# Patient Record
Sex: Male | Born: 1975
Health system: Southern US, Community
[De-identification: ages and names within clinical notes are randomized; demographics above are authoritative.]

## PROBLEM LIST (undated history)

## (undated) DIAGNOSIS — K219 Gastro-esophageal reflux disease without esophagitis: Secondary | ICD-10-CM

## (undated) DIAGNOSIS — L0231 Cutaneous abscess of buttock: Secondary | ICD-10-CM

## (undated) DIAGNOSIS — Z87442 Personal history of urinary calculi: Secondary | ICD-10-CM

## (undated) DIAGNOSIS — L723 Sebaceous cyst: Secondary | ICD-10-CM

## (undated) DIAGNOSIS — N400 Enlarged prostate without lower urinary tract symptoms: Secondary | ICD-10-CM

## (undated) DIAGNOSIS — N492 Inflammatory disorders of scrotum: Secondary | ICD-10-CM

## (undated) DIAGNOSIS — Z8241 Family history of sudden cardiac death: Secondary | ICD-10-CM

## (undated) DIAGNOSIS — K449 Diaphragmatic hernia without obstruction or gangrene: Secondary | ICD-10-CM

## (undated) HISTORY — PX: PILONIDAL CYST DRAINAGE: SHX743

## (undated) HISTORY — DX: Cutaneous abscess of buttock: L02.31

## (undated) HISTORY — PX: PILONIDAL CYST EXCISION: SHX744

## (undated) HISTORY — DX: Inflammatory disorders of scrotum: N49.2

## (undated) HISTORY — DX: Gastro-esophageal reflux disease without esophagitis: K21.9

---

## 1998-06-06 ENCOUNTER — Emergency Department (HOSPITAL_COMMUNITY): Admission: EM | Admit: 1998-06-06 | Discharge: 1998-06-06 | Payer: Self-pay | Admitting: Emergency Medicine

## 1999-04-09 ENCOUNTER — Emergency Department (HOSPITAL_COMMUNITY): Admission: EM | Admit: 1999-04-09 | Discharge: 1999-04-09 | Payer: Self-pay | Admitting: *Deleted

## 2004-04-30 ENCOUNTER — Ambulatory Visit (HOSPITAL_BASED_OUTPATIENT_CLINIC_OR_DEPARTMENT_OTHER): Admission: RE | Admit: 2004-04-30 | Discharge: 2004-04-30 | Payer: Self-pay | Admitting: General Surgery

## 2004-04-30 ENCOUNTER — Ambulatory Visit (HOSPITAL_COMMUNITY): Admission: RE | Admit: 2004-04-30 | Discharge: 2004-04-30 | Payer: Self-pay | Admitting: General Surgery

## 2004-04-30 ENCOUNTER — Encounter (INDEPENDENT_AMBULATORY_CARE_PROVIDER_SITE_OTHER): Payer: Self-pay | Admitting: Specialist

## 2004-07-18 ENCOUNTER — Emergency Department (HOSPITAL_COMMUNITY): Admission: AD | Admit: 2004-07-18 | Discharge: 2004-07-18 | Payer: Self-pay | Admitting: Family Medicine

## 2004-09-30 ENCOUNTER — Emergency Department (HOSPITAL_COMMUNITY): Admission: EM | Admit: 2004-09-30 | Discharge: 2004-09-30 | Payer: Self-pay | Admitting: Family Medicine

## 2005-05-15 ENCOUNTER — Emergency Department (HOSPITAL_COMMUNITY): Admission: EM | Admit: 2005-05-15 | Discharge: 2005-05-15 | Payer: Self-pay | Admitting: Family Medicine

## 2009-04-01 ENCOUNTER — Emergency Department (HOSPITAL_COMMUNITY): Admission: EM | Admit: 2009-04-01 | Discharge: 2009-04-01 | Payer: Self-pay | Admitting: Family Medicine

## 2010-05-23 LAB — POCT URINALYSIS DIP (DEVICE)
Nitrite: NEGATIVE
Protein, ur: NEGATIVE mg/dL
Urobilinogen, UA: 0.2 mg/dL (ref 0.0–1.0)
pH: 6 (ref 5.0–8.0)

## 2010-05-23 LAB — GC/CHLAMYDIA PROBE AMP, GENITAL: GC Probe Amp, Genital: NEGATIVE

## 2010-07-23 NOTE — Op Note (Signed)
NAMEJACQUISE, Anthony Farley                  ACCOUNT NO.:  [id number]   MEDICAL RECORD NO.:  [id number]          PATIENT TYPE:  AMB   LOCATION:  [facility]                         FACILITY:  [facility]   PHYSICIAN:  Leonie Man, M.D.   DATE OF BIRTH:  Oct 25, 1975   DATE OF PROCEDURE:  04/30/2004  DATE OF DISCHARGE:                                 OPERATIVE REPORT   PREOPERATIVE DIAGNOSIS:  Recurrent pilonidal cyst.   POSTOPERATIVE DIAGNOSIS:  Recurrent pilonidal cyst.   PROCEDURE:  Excision and marsupialization of pilonidal cyst.   SURGEON:  Leonie Man, M.D.   ASSISTANT:  Nurse.   ANESTHESIA:  General.   Note, the patient is a 35 year old man with a recurrent pilonidal cyst with  drainage.  He has had this operated on twice before.  He comes to the  operating room now for excision of the residual cyst which extends down from  the primary pilonidal sinus onto the left buttock cheek.   DESCRIPTION OF PROCEDURE:  Following induction of satisfactory general  anesthesia, the patient is positioned supinely and the buttock is spread  apart and held with tape  after he is been turned into the prone position.  The area is shaved, prepped and draped to be included in the sterile  operative field. The inflamed cyst extended from the primary midline sinus  in an ellipse down onto the left buttock cheek. This was incised down upon  carrying dissection down to the presacral fascia in the midline. The entire  sinus, pilonidal and sinus tract were removed and forwarded for pathologic  evaluation. Hemostasis was secured with electrocautery. The edges of the  defect are then sutured down to the presacral fascia with interrupted  sutures of #0 Vicryl. Hemostasis was assured and the remaining marsupialized  areas packed with Xeroform gauze. A sterile dressing is applied. The  anesthetic reversed and the patient removed from the operating room to the  recovery room in stable condition. He tolerated the procedure  well.      PB/MEDQ  D:  04/30/2004  T:  04/30/2004  Job:  [id number]

## 2011-11-10 ENCOUNTER — Ambulatory Visit (INDEPENDENT_AMBULATORY_CARE_PROVIDER_SITE_OTHER): Payer: 59 | Admitting: General Surgery

## 2011-11-10 ENCOUNTER — Encounter (INDEPENDENT_AMBULATORY_CARE_PROVIDER_SITE_OTHER): Payer: Self-pay | Admitting: General Surgery

## 2011-11-10 VITALS — BP 122/78 | HR 72 | Temp 97.8°F | Resp 16 | Ht 68.0 in | Wt 191.0 lb

## 2011-11-10 DIAGNOSIS — L723 Sebaceous cyst: Secondary | ICD-10-CM

## 2011-11-10 DIAGNOSIS — L729 Follicular cyst of the skin and subcutaneous tissue, unspecified: Secondary | ICD-10-CM

## 2011-11-10 NOTE — Patient Instructions (Signed)
You have a chronically infected cyst in your right scrotum. You will be scheduled for excision of this cyst under local anesthesia.  You have extensive scarring from your multiple prior pilonidal surgeries. At this time there is no evidence of any infection, drainage, fluid collection, or cellulitis. I do not recommend any surgery on her pilonidal area at this time. Tt may become necessary in the future if you have further infection.

## 2011-11-10 NOTE — Progress Notes (Signed)
Patient ID: Anthony Farley, male   DOB: Apr 23, 1975, 36 y.o.   MRN: [id number]  Chief Complaint  Patient presents with  . New Evaluation    eval pil cyst    HPI Anthony Farley is a 36 y.o. male.  He is self-referred for evaluation of a chronically infected cyst of the right scrotum. He also was be to check his palate area where he's had multiple previous operations.  He developed a painful draining cyst in his right scrotum about 2 months ago. He saw an urgent care physician who performed incision and drainage and he was on antibiotics for 12  days. It is a little painful but continues to be a palpable mass that drains.  He also states that he has had multiple surgeries for pilonidal cyst by Dr. Lurene Shadow.   He says he had 3 operations under general anesthesia and other drainage procedures in the office. He states this intermittently will flare up and drain and he wants to know if further surgery is necessary.  Past history is significant for tobacco abuse, mild reflux symptoms and otherwise healthy. 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 HPI  Past Medical History  Diagnosis Date  . GERD (gastroesophageal reflux disease)     Past Surgical History  Procedure Date  . Pilonidal cyst excision   . Pilonidal cyst drainage     Family History  Problem Relation Age of Onset  . Heart disease Father     Social History History  Substance Use Topics  . Smoking status: Current Everyday Smoker -- 1.0 packs/day  . Smokeless tobacco: Not on file  . Alcohol Use: No    No Known Allergies  Current Outpatient Prescriptions  Medication Sig Dispense Refill  . Multiple Vitamins-Minerals (MULTIVITAMIN WITH MINERALS) tablet Take 1 tablet by mouth daily.        Review of Systems Review of Systems  Constitutional: Negative for fever, chills and unexpected weight change.  HENT: Negative for hearing loss, congestion, sore throat,  trouble swallowing and voice change.   Eyes: Negative for visual disturbance.  Respiratory: Negative for cough and wheezing.   Cardiovascular: Negative for chest pain, palpitations and leg swelling.  Gastrointestinal: Negative for nausea, vomiting, abdominal pain, diarrhea, constipation, blood in stool, abdominal distention, anal bleeding and rectal pain.  Genitourinary: Positive for scrotal swelling. Negative for hematuria and difficulty urinating.  Musculoskeletal: Negative for arthralgias.  Skin: Negative for rash and wound.  Neurological: Negative for seizures, syncope, weakness and headaches.  Hematological: Negative for adenopathy. Does not bruise/bleed easily.  Psychiatric/Behavioral: Negative for confusion.    Blood pressure 122/78, pulse 72, temperature 97.8 F (36.6 C), resp. rate 16, height 5\' 8"  (1.727 m), weight 191 lb (86.637 kg).  Physical Exam Physical Exam  Constitutional: He is oriented to person, place, and time. He appears well-developed and well-nourished. No distress.  HENT:  Head: Normocephalic.  Nose: Nose normal.  Mouth/Throat: No oropharyngeal exudate.  Eyes: Conjunctivae and EOM are normal. Pupils are equal, round, and reactive to light. Right eye exhibits no discharge. Left eye exhibits no discharge. No scleral icterus.  Neck: Normal range of motion. Neck supple. No JVD present. No tracheal deviation present. No thyromegaly present.  Cardiovascular: Normal rate, regular rhythm, normal heart sounds and intact distal pulses.   No murmur heard. Pulmonary/Chest: Effort normal and breath sounds normal. No stridor. No respiratory distress. He has no  wheezes. He has no rales. He exhibits no tenderness.  Abdominal: Soft. Bowel sounds are normal. He exhibits no distension and no mass. There is no tenderness. There is no rebound and no guarding.  Genitourinary:       1 cm inflamed mass in the right scrotum proximally. There is an open dimple. This appears to be a  chronic inflammatory cyst. No other scrotal masses. Penis scrotum and testes are normal.  Musculoskeletal: Normal range of motion. He exhibits no edema and no tenderness.  Lymphadenopathy:    He has no cervical adenopathy.  Neurological: He is alert and oriented to person, place, and time. He has normal reflexes. Coordination normal.  Skin: Skin is warm and dry. No rash noted. He is not diaphoretic. No erythema. No pallor.       Pilonidal area reveals multiple midline scars. This appears well healed. The tissues are soft and nontender. There is no draining sinus. There is no palpable mass or fluid collection. There is no cellulitis. There is no tenderness.  Psychiatric: He has a normal mood and affect. His behavior is normal. Judgment and thought content normal.    Data Reviewed   Assessment    Chronically infected right scrotal cyst. Excision under local anesthesia advised.  History multiple procedures for pilonidal cyst and sinus. Currently the wounds appear well healed and there does not appear to be any indication for surgical intervention  Tobacco abuse    Plan    The patient would like to have the scrotal cyst excised, and we will schedule that to be done under local anesthesia in the near future.  I discussed the indications, details, techniques, and numerous risks with the patient and his wife. They understand these issues. His questions were answered. He agrees with this plan.       Angelia Mould. Derrell Lolling, M.D., [facility] General and Minimally invasive Surgery Breast and Colorectal Surgery Office:   [phone] Pager:   [phone]  11/10/2011, 9:41 AM

## 2011-11-16 NOTE — H&P (Signed)
Anthony Farley    MRN: [id number]   Description: 36 year old male  Provider: Ernestene Mention, MD  Department: [facility]       Diagnoses     Scrotal cyst   - Primary    706.2        Vitals -    BP Pulse Temp Resp Ht Wt    122/78 72 97.8 F (36.6 C) 16 5\' 8"  (1.727 m) 191 lb (86.637 kg)    BMI - 29.04 kg/m2                 History and Physical    Ernestene Mention, MD  Patient ID: Anthony Farley, male   DOB: 13-Jan-1976, 36 y.o.   MRN: [id number]               HPI Anthony Farley is a 36 y.o. male.  He is self-referred for evaluation of a chronically infected cyst of the right scrotum. He also was be to check his palate area where he's had multiple previous operations.   He developed a painful draining cyst in his right scrotum about 2 months ago. He saw an urgent care physician who performed incision and drainage and he was on antibiotics for 12  days. It is a little painful but continues to be a palpable mass that drains.   He also states that he has had multiple surgeries for pilonidal cyst by Dr. Lurene Shadow.   He says he had 3 operations under general anesthesia and other drainage procedures in the office. He states this intermittently will flare up and drain and he wants to know if further surgery is necessary.   Past history is significant for tobacco abuse, mild reflux symptoms and otherwise healthy. 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     Past Medical History   Diagnosis  Date   .  GERD (gastroesophageal reflux disease)         Past Surgical History   Procedure  Date   .  Pilonidal cyst excision     .  Pilonidal cyst drainage         Family History   Problem  Relation  Age of Onset   .  Heart disease  Father        Social History History   Substance Use Topics   .  Smoking status:  Current Everyday Smoker -- 1.0 packs/day   .  Smokeless tobacco:  Not on file   .   Alcohol Use:  No      No Known Allergies    Current Outpatient Prescriptions   Medication  Sig  Dispense  Refill   .  Multiple Vitamins-Minerals (MULTIVITAMIN WITH MINERALS) tablet  Take 1 tablet by mouth daily.            Review of Systems   Constitutional: Negative for fever, chills and unexpected weight change.  HENT: Negative for hearing loss, congestion, sore throat, trouble swallowing and voice change.   Eyes: Negative for visual disturbance.  Respiratory: Negative for cough and wheezing.   Cardiovascular: Negative for chest pain, palpitations and leg swelling.  Gastrointestinal: Negative for nausea, vomiting, abdominal pain, diarrhea, constipation, blood in stool, abdominal distention, anal bleeding and rectal pain.  Genitourinary: Positive for scrotal swelling. Negative for hematuria and difficulty urinating.  Musculoskeletal: Negative for arthralgias.  Skin: Negative for rash  and wound.  Neurological: Negative for seizures, syncope, weakness and headaches.  Hematological: Negative for adenopathy. Does not bruise/bleed easily.  Psychiatric/Behavioral: Negative for confusion.    Blood pressure 122/78, pulse 72, temperature 97.8 F (36.6 C), resp. rate 16, height 5\' 8"  (1.727 m), weight 191 lb (86.637 kg).   Physical Exam   Constitutional: He is oriented to person, place, and time. He appears well-developed and well-nourished. No distress.  HENT:   Head: Normocephalic.   Nose: Nose normal.   Mouth/Throat: No oropharyngeal exudate.  Eyes: Conjunctivae and EOM are normal. Pupils are equal, round, and reactive to light. Right eye exhibits no discharge. Left eye exhibits no discharge. No scleral icterus.  Neck: Normal range of motion. Neck supple. No JVD present. No tracheal deviation present. No thyromegaly present.  Cardiovascular: Normal rate, regular rhythm, normal heart sounds and intact distal pulses.    No murmur heard. Pulmonary/Chest: Effort normal and breath  sounds normal. No stridor. No respiratory distress. He has no wheezes. He has no rales. He exhibits no tenderness.  Abdominal: Soft. Bowel sounds are normal. He exhibits no distension and no mass. There is no tenderness. There is no rebound and no guarding.  Genitourinary:       1 cm inflamed mass in the right scrotum proximally. There is an open dimple. This appears to be a chronic inflammatory cyst. No other scrotal masses. Penis scrotum and testes are normal.  Musculoskeletal: Normal range of motion. He exhibits no edema and no tenderness.  Lymphadenopathy:    He has no cervical adenopathy.  Neurological: He is alert and oriented to person, place, and time. He has normal reflexes. Coordination normal.  Skin: Skin is warm and dry. No rash noted. He is not diaphoretic. No erythema. No pallor.       Pilonidal area reveals multiple midline scars. This appears well healed. The tissues are soft and nontender. There is no draining sinus. There is no palpable mass or fluid collection. There is no cellulitis. There is no tenderness.  Psychiatric: He has a normal mood and affect. His behavior is normal. Judgment and thought content normal.    Data Reviewed     Assessment Chronically infected right scrotal cyst. Excision under local anesthesia advised.   History multiple procedures for pilonidal cyst and sinus. Currently the wounds appear well healed and there does not appear to be any indication for surgical intervention   Tobacco abuse   Plan The patient would like to have the scrotal cyst excised, and we will schedule that to be done under local anesthesia in the near future.   I discussed the indications, details, techniques, and numerous risks with the patient and his wife. They understand these issues. His questions were answered. He agrees with this plan.       Angelia Mould. Derrell Lolling, M.D., [facility] General and Minimally invasive Surgery Breast and  Colorectal Surgery Office:   [phone] Pager:   [phone]

## 2011-11-18 ENCOUNTER — Encounter (HOSPITAL_BASED_OUTPATIENT_CLINIC_OR_DEPARTMENT_OTHER): Payer: Self-pay | Admitting: *Deleted

## 2011-11-18 ENCOUNTER — Ambulatory Visit (HOSPITAL_BASED_OUTPATIENT_CLINIC_OR_DEPARTMENT_OTHER)
Admission: RE | Admit: 2011-11-18 | Discharge: 2011-11-18 | Disposition: A | Discharge status: Home / Self Care | Payer: 59 | Source: Ambulatory Visit | Attending: General Surgery | Admitting: General Surgery

## 2011-11-18 ENCOUNTER — Encounter (HOSPITAL_BASED_OUTPATIENT_CLINIC_OR_DEPARTMENT_OTHER)
Admission: RE | Disposition: A | Discharge status: Home / Self Care | Payer: Self-pay | Source: Ambulatory Visit | Attending: General Surgery

## 2011-11-18 DIAGNOSIS — L723 Sebaceous cyst: Secondary | ICD-10-CM | POA: Insufficient documentation

## 2011-11-18 HISTORY — PX: MASS EXCISION: SHX2000

## 2011-11-18 SURGERY — MINOR EXCISION OF MASS
Anesthesia: LOCAL | Site: Scrotum | Wound class: Clean

## 2011-11-18 MED ORDER — AMOXICILLIN-POT CLAVULANATE 875-125 MG PO TABS: 1.0000 | ORAL_TABLET | Freq: Two times a day (BID) | ORAL | Status: AC

## 2011-11-18 MED ORDER — CHLORHEXIDINE GLUCONATE 4 % EX LIQD: 1.0000 "application " | Freq: Once | CUTANEOUS | Status: DC

## 2011-11-18 MED ORDER — SODIUM BICARBONATE 4 % IV SOLN
INTRAVENOUS | Status: DC | PRN
  Administered 2011-11-18: 07:00:00 via INTRAMUSCULAR

## 2011-11-18 MED ORDER — HYDROCODONE-ACETAMINOPHEN 5-325 MG PO TABS: 1.0000 | ORAL_TABLET | ORAL | Status: AC | PRN

## 2011-11-18 SURGICAL SUPPLY — 38 items
ADH SKN CLS APL DERMABOND .7 (GAUZE/BANDAGES/DRESSINGS)
APL SKNCLS STERI-STRIP NONHPOA (GAUZE/BANDAGES/DRESSINGS)
BALL CTTN LRG ABS STRL LF (GAUZE/BANDAGES/DRESSINGS)
BENZOIN TINCTURE PRP APPL 2/3 (GAUZE/BANDAGES/DRESSINGS) IMPLANT
BLADE SURG 15 STRL LF DISP TIS (BLADE) ×1 IMPLANT
BLADE SURG 15 STRL SS (BLADE) ×2
CLOTH BEACON ORANGE TIMEOUT ST (SAFETY) ×2 IMPLANT
COTTONBALL LRG STERILE PKG (GAUZE/BANDAGES/DRESSINGS) IMPLANT
DERMABOND ADVANCED (GAUZE/BANDAGES/DRESSINGS)
DERMABOND ADVANCED .7 DNX12 (GAUZE/BANDAGES/DRESSINGS) IMPLANT
DRAPE SURG 17X23 STRL (DRAPES) ×1 IMPLANT
ELECT REM PT RETURN 9FT ADLT (ELECTROSURGICAL)
ELECTRODE REM PT RTRN 9FT ADLT (ELECTROSURGICAL) IMPLANT
GAUZE SPONGE 4X4 12PLY STRL LF (GAUZE/BANDAGES/DRESSINGS) IMPLANT
GLOVE ECLIPSE 6.5 STRL STRAW (GLOVE) ×1 IMPLANT
GLOVE EUDERMIC 7 POWDERFREE (GLOVE) ×2 IMPLANT
MARKER SKIN DUAL TIP RULER LAB (MISCELLANEOUS) ×2 IMPLANT
NDL HYPO 25X1 1.5 SAFETY (NEEDLE) ×1 IMPLANT
NDL SAFETY ECLIPSE 18X1.5 (NEEDLE) ×1 IMPLANT
NEEDLE HYPO 18GX1.5 SHARP (NEEDLE)
NEEDLE HYPO 25X1 1.5 SAFETY (NEEDLE) ×2 IMPLANT
NS IRRIG 1000ML POUR BTL (IV SOLUTION) ×1 IMPLANT
PENCIL BUTTON HOLSTER BLD 10FT (ELECTRODE) IMPLANT
STRIP CLOSURE SKIN 1/2X4 (GAUZE/BANDAGES/DRESSINGS) IMPLANT
SUT MNCRL AB 3-0 PS2 18 (SUTURE) IMPLANT
SUT MON AB 4-0 PC3 18 (SUTURE) IMPLANT
SUT SILK 3 0 TIES 17X18 (SUTURE)
SUT SILK 3-0 18XBRD TIE BLK (SUTURE) IMPLANT
SUT VIC AB 4-0 P-3 18XBRD (SUTURE) IMPLANT
SUT VIC AB 4-0 P3 18 (SUTURE) ×4
SUT VIC AB 5-0 P-3 18X BRD (SUTURE) IMPLANT
SUT VIC AB 5-0 P3 18 (SUTURE)
SUT VICRYL 3-0 CR8 SH (SUTURE) IMPLANT
SUT VICRYL AB 3 0 TIES (SUTURE) IMPLANT
SWABSTICK POVIDONE IODINE SNGL (MISCELLANEOUS) ×2 IMPLANT
SYR CONTROL 10ML LL (SYRINGE) ×2 IMPLANT
TOWEL OR 17X24 6PK STRL BLUE (TOWEL DISPOSABLE) IMPLANT
TRAY DSU PREP LF (CUSTOM PROCEDURE TRAY) IMPLANT

## 2011-11-18 NOTE — Op Note (Signed)
Patient Name:           [name]   Date of Surgery:        11/18/2011  Pre op Diagnosis:      Inflamed right scrotal mass, 2 cm in  Post op Diagnosis:    same  Procedure:             Excision 2 cm right scrotal mass      Surgeon:                     Angelia Mould. Derrell Lolling, M.D., FACS  Assistant:                      none  Operative Indication:   Anthony Farley is a 37 y.o. male. He is self-referred for evaluation of a chronically infected cyst of the right scrotum. He developed a painful draining cyst in his right scrotum about 2 months ago. He saw an urgent care physician who performed incision and drainage and he was on antibiotics for 12 days. It is a little painful but continues to be a palpable mass that drains. Exam reveals a 2 cm chronically inflamed mass in the right proximal scrotum with 2 small draining sites. This appears to be a very localized process involving skin subcutaneous tissue. He is brought to the operating room electively for excision of this area.       Operative Findings:       Chronic inflammatory mass right scrotum, suspect folliculitis or apocrine gland infection  Procedure in Detail:          The patient was taken to room 5 at [facility]. The procedure was performed under local anesthesia. The genitalia and scrotum were prepped and draped in a sterile fashion. Surgical time out was performed. 1% Xylocaine with epinephrine was used as a local infiltration anesthetic. A longitudinal elliptical incision was made excising these skin and subcutaneous tissue that was involved. The deeper tissues appeared soft and uninvolved. The specimen was sent to pathology. Hemostasis was excellent. The dermis was closed with some interrupted subcuticular sutures of 4-0 Vicryl. Antibiotic ointment, bandages and fishnet panties were placed. He tolerated this well. EBL 5 cc. Counts correct. Complications none.     Angelia Mould. Derrell Lolling, M.D., FACS General and Minimally Invasive Surgery Breast  and Colorectal Surgery  11/18/2011 7:43 AM

## 2011-11-18 NOTE — Interval H&P Note (Signed)
History and Physical Interval Note:  11/18/2011 7:09 AM  Anthony Farley  has presented today for surgery, with the diagnosis of scrotal cyst  The goals and the various methods of treatment have been discussed with the patient and family. After consideration of risks, benefits and other options for treatment, the patient has consented to  Procedure(s) (LRB) with comments: MINOR EXCISION OF MASS (N/A) - excise scrotal mass as a surgical intervention .  The patient's history has been reviewed, patient examined, no change in status, stable for surgery.  I have reviewed the patient's chart and labs.  Questions were answered to the patient's satisfaction.     Ernestene Mention

## 2011-11-21 NOTE — Progress Notes (Signed)
Quick Note:  Inform patient of Pathology report,. "benign cyst" ______ 

## 2011-11-22 ENCOUNTER — Telehealth (INDEPENDENT_AMBULATORY_CARE_PROVIDER_SITE_OTHER): Payer: Self-pay | Admitting: General Surgery

## 2011-11-22 ENCOUNTER — Encounter (HOSPITAL_BASED_OUTPATIENT_CLINIC_OR_DEPARTMENT_OTHER): Payer: Self-pay | Admitting: General Surgery

## 2011-11-22 NOTE — Telephone Encounter (Signed)
Called patient per Dr. Jacinto Halim request and advised of pathology report "benign cyst".

## 2011-12-01 ENCOUNTER — Ambulatory Visit (INDEPENDENT_AMBULATORY_CARE_PROVIDER_SITE_OTHER): Payer: 59 | Admitting: General Surgery

## 2011-12-01 ENCOUNTER — Encounter (INDEPENDENT_AMBULATORY_CARE_PROVIDER_SITE_OTHER): Payer: Self-pay | Admitting: General Surgery

## 2011-12-01 VITALS — BP 110/78 | HR 72 | Temp 97.4°F | Resp 12 | Ht 68.0 in | Wt 192.0 lb

## 2011-12-01 DIAGNOSIS — L723 Sebaceous cyst: Secondary | ICD-10-CM

## 2011-12-01 DIAGNOSIS — L729 Follicular cyst of the skin and subcutaneous tissue, unspecified: Secondary | ICD-10-CM

## 2011-12-01 NOTE — Patient Instructions (Signed)
The cyst in your right scrotum was a chronically inflamed, chronically infected epidermal inclusion cyst. There was no cancer.  The small open wound in the scrotum might be slightly infected, and so you have been given a prescription for Augmentin antibiotic, and your should take this pill every 12 hours for 8 days.  The wound should completely heal without any problems in 2 weeks. If it does not, then  return to see me in the office.

## 2011-12-01 NOTE — Progress Notes (Signed)
Patient ID: Anthony Farley, male   DOB: Oct 09, 1975, 36 y.o.   MRN: [id number] History: This patient underwent excision of a chronically infected cyst in his right scrotum on 11/18/2010. Final pathology shows a benign, ruptured follicle cyst or epidermal inclusion cyst. He states that he did well but this weekend it opened up and drained. Not much pain  Exam: Patient looks well. No distress. Scrotal wound, right scrotum shows a small opening, a slight tissue thickening, no purulence, no necrosis, no undrained pus.  Assessment: Chronically infected epidermal inclusion cyst right scrotum, status post excision. Wound not completely healed. I am not sure whether this is infected or not  Plan: Wound care, skin hygiene, bathing discussed. Augmentin 875 mg twice a day x 8 days He want to go back to work for another 10 days, and I filled out a back to work form for him for that. I told him that this should completely heal without any problems in 2 weeks. Return to see me if it does not heal completely.   Angelia Mould. Derrell Lolling, M.D., [facility] General and Minimally invasive Surgery Breast and Colorectal Surgery Office:   [phone] Pager:   [phone]

## 2011-12-12 ENCOUNTER — Telehealth (INDEPENDENT_AMBULATORY_CARE_PROVIDER_SITE_OTHER): Payer: Self-pay | Admitting: General Surgery

## 2011-12-12 NOTE — Telephone Encounter (Signed)
Patient called in and explained that he has a cyst removed and that he has just finished his second round of antibiotics but is still having drainage.  He was instructed to call back if he was still having drainage and had finished the antibiotic.  I was able to find him an opening on 10/17 at 8:30.  The patient took this spot.  He explained he is supposed to return to work tomorrow.  Advised the patient to try to keep this area dry and covered so it does not get infected.  He was okay with this.

## 2011-12-22 ENCOUNTER — Encounter (INDEPENDENT_AMBULATORY_CARE_PROVIDER_SITE_OTHER): Payer: 59 | Admitting: General Surgery

## 2012-01-05 ENCOUNTER — Ambulatory Visit (INDEPENDENT_AMBULATORY_CARE_PROVIDER_SITE_OTHER): Payer: 59 | Admitting: General Surgery

## 2012-01-05 ENCOUNTER — Encounter (INDEPENDENT_AMBULATORY_CARE_PROVIDER_SITE_OTHER): Payer: Self-pay | Admitting: General Surgery

## 2012-01-05 VITALS — BP 136/80 | HR 78 | Temp 98.6°F | Resp 18 | Ht 68.0 in | Wt 194.2 lb

## 2012-01-05 DIAGNOSIS — L729 Follicular cyst of the skin and subcutaneous tissue, unspecified: Secondary | ICD-10-CM

## 2012-01-05 DIAGNOSIS — L723 Sebaceous cyst: Secondary | ICD-10-CM

## 2012-01-05 NOTE — Patient Instructions (Signed)
The incision where your previous right scrotal cyst was excised has now completely healed. You have developed 2 new skin abscesses in the right scrotum. This looks like an infected hair follicles.  Your initial treatment will be clindamycin antibiotic pills for 7 days and warm soaks 3 times a day  Return to see Dr. Derrell Lolling if this does not completely clear it up.

## 2012-01-05 NOTE — Progress Notes (Signed)
Patient ID: Anthony Farley, male   DOB: [birth year], 36 y.o.   MRN: [id number] History: This patient underwent excision of an infected ruptured follicle cyst in the right scrotum of 11/18/2011. It was slow to heal but it has ultimately completely healed. He now presents with 2 new infected nodules lower down in the scrotum.  Exam: Patient is well appearing in no distress. Right scrotum was examined. The incision higher in the scrotum has completely healed. There are 2 inflamed follicle cyst in the scrotum with a little bit of drainage. Not fluctuant. Very localized. I examined both inguinal and both axillary areas and there is no evidence of hidradenitis.  Assessment: 2 new inflamed follicle cysts of right scrotum Uneventful healing of right scrotal cyst excision site  Plan: Clindamycin 300 mg every 6 hours x 7 days warm soaks 3 times daily Return to see me if this does not completely clear up. This may require further excision I did tell him about the diagnosis of hidradenitis, but there was no clinical evidence of that at this time.   Angelia Mould. Derrell Lolling, M.D., [facility] General and Minimally invasive Surgery Breast and Colorectal Surgery Office:   [phone] Pager:   [phone]

## 2012-02-07 ENCOUNTER — Encounter (INDEPENDENT_AMBULATORY_CARE_PROVIDER_SITE_OTHER): Payer: Self-pay | Admitting: Surgery

## 2012-02-07 ENCOUNTER — Ambulatory Visit (INDEPENDENT_AMBULATORY_CARE_PROVIDER_SITE_OTHER): Payer: 59 | Admitting: Surgery

## 2012-02-07 VITALS — BP 132/72 | HR 76 | Temp 98.1°F | Resp 16 | Ht 68.0 in | Wt 197.2 lb

## 2012-02-07 DIAGNOSIS — L0501 Pilonidal cyst with abscess: Secondary | ICD-10-CM | POA: Insufficient documentation

## 2012-02-07 NOTE — Progress Notes (Signed)
General Surgery [facility]  Visit Diagnoses: 1. Pilonidal cyst with abscess     HISTORY: The patient is a 36 year old black male known to our surgical practice from multiple incision and drainage of cyst involving the scrotum and perineum. Patient has a complex history of pilonidal cyst disease and has undergone at least 3 previous procedures for management. Over the past several days he has developed pain and swelling in the region of the natal cleft. He was seen 2 days ago and again today at the urgent care facility and underwent incision, drainage, and open packing. He was referred to general surgery for evaluation for eventual definitive surgery.  EXAM: Examination of the natal cleft shows an acute inflammatory process with induration. There is an open wound with gauze packing in place. There is minimal cellulitis. There is no residual fluctuance.  IMPRESSION: Pilonidal cyst with abscess, status post incision, drainage, and open packing  PLAN: Patient has been prescribed to antibiotics. He is scheduled to return to the urgent care tomorrow for dressing change. I think this is appropriate management at this time.  Patient will require definitive surgical resection. I would like to do this when there is not active infection ongoing. I have asked the patient to return in 6 weeks for wound check and to discuss definitive surgery. I have provided him with written literature to review. Certainly if he fails to progress with his current regimen, he should contact us and return for further surgical management.  Velora Heckler, MD, FACS General & Endocrine Surgery [facility]

## 2012-02-07 NOTE — Patient Instructions (Signed)
Pilonidal Cyst  A pilonidal cyst occurs when hairs get trapped (ingrown) beneath the skin in the crease between the buttocks over your sacrum (the bone under that crease). Pilonidal cysts are most common in young men with a lot of body hair. When the cyst is ruptured (breaks) or leaking, fluid from the cyst may cause burning and itching. If the cyst becomes infected, it causes a painful swelling filled with pus (abscess). The pus and trapped hairs need to be removed (often by lancing) so that the infection can heal. However, recurrence is common and an operation may be needed to remove the cyst.  HOME CARE INSTRUCTIONS    If the cyst was NOT INFECTED:   Keep the area clean and dry. Bathe or shower daily. Wash the area well with a germ-killing soap. Warm tub baths may help prevent infection and help with drainage. Dry the area well with a towel.   Avoid tight clothing to keep area as moisture free as possible.   Keep area between buttocks as free of hair as possible. A depilatory may be used.   If the cyst WAS INFECTED and needed to be drained:   Your caregiver packed the wound with gauze to keep the wound open. This allows the wound to heal from the inside outwards and continue draining.   Return for a wound check in 1 day or as suggested.   If you take tub baths or showers, repack the wound with gauze following them. Sponge baths (at the sink) are a good alternative.   If an antibiotic was ordered to fight the infection, take as directed.   Only take over-the-counter or prescription medicines for pain, discomfort, or fever as directed by your caregiver.   After the drain is removed, use sitz baths for 20 minutes 4 times per day. Clean the wound gently with mild unscented soap, pat dry, and then apply a dry dressing.  SEEK MEDICAL CARE IF:    You have increased pain, swelling, redness, drainage, or bleeding from the area.   You have a fever.   You have muscles aches, dizziness, or a general ill  feeling.  Document Released: 02/19/2000 Document Revised: 05/16/2011 Document Reviewed: 04/18/2008  Exit[employer] Patient Information 2013 [employer].

## 2012-03-21 ENCOUNTER — Ambulatory Visit (INDEPENDENT_AMBULATORY_CARE_PROVIDER_SITE_OTHER): Payer: 59 | Admitting: Surgery

## 2012-03-22 ENCOUNTER — Encounter (INDEPENDENT_AMBULATORY_CARE_PROVIDER_SITE_OTHER): Payer: Self-pay | Admitting: Surgery

## 2012-03-30 ENCOUNTER — Ambulatory Visit (INDEPENDENT_AMBULATORY_CARE_PROVIDER_SITE_OTHER): Payer: 59 | Admitting: General Surgery

## 2012-03-30 ENCOUNTER — Encounter (INDEPENDENT_AMBULATORY_CARE_PROVIDER_SITE_OTHER): Payer: Self-pay | Admitting: General Surgery

## 2012-03-30 VITALS — BP 117/63 | HR 76 | Temp 97.5°F | Resp 14 | Ht 68.0 in | Wt 200.6 lb

## 2012-03-30 DIAGNOSIS — L0501 Pilonidal cyst with abscess: Secondary | ICD-10-CM

## 2012-03-30 MED ORDER — DOXYCYCLINE HYCLATE 100 MG PO TABS: 100.0000 mg | ORAL_TABLET | Freq: Two times a day (BID) | ORAL | Status: AC

## 2012-03-30 NOTE — Patient Instructions (Signed)
Pick up prescription at pharmacy Change outer gauze daily. Remove packing strip on Sunday Can use over the counter ointment for jock itch

## 2012-03-30 NOTE — Progress Notes (Signed)
Subjective:     Patient ID: Anthony Farley, male   DOB: [birth year], 37 y.o.   MRN: [id number]  HPI 37 year old male comes in because of recurrent swelling and pain at his natal cleft. Patient has a history of infected pilonidal cyst. He also has undergone prior pilonidal cyst removals. He is scheduled for followup with Dr. Gerrit Friends in a few weeks to discuss definitive surgical management. He states that he started having some swelling last week. Earlier this week the area became very uncomfortable and tender and it started spontaneously draining earlier today. He denies any fevers or chills. He reports a good appetite. He also complains of some scrotal itching and burning.  Review of Systems     Objective:   Physical Exam BP 117/63  Pulse 76  Temp 97.5 F (36.4 C) (Temporal)  Resp 14  Ht 5\' 8"  (1.727 m)  Wt 200 lb 9.6 oz (90.992 kg)  BMI 30.50 kg/m2 Alert, nad nonfocal Gluteal cleft - old scar in midline extending to left side. Has opening to left of gluteal cleft at inf aspect of old incision, opening about 1cm, no cellulitis. Some surrounding induration. Tracks with q tip about 2cm toward midline Scrotum-he has some skin irritation over his left scrotum    Assessment:     Recurrent infected pilonidal cyst Jock itch    Plan:     The area appears adequately drained with respect to spinal cyst. I did place a quarter inch of iodoform gauze into the defect. I encouraged them to change the outer dressing daily and to remove the packing strip on Sunday. I gave him a prescription for doxycycline for one week. With respect to the scrotal itching and burning this appears to be consistent with jock itch. I recommended that he get over-the-counter ointment for jock itch. Otherwise followup Dr Gerrit Friends at previously scheduled appointment  Anthony Farley. [name], MD, FACS General, Bariatric, & Minimally Invasive Surgery [facility], [place]

## 2012-12-04 ENCOUNTER — Ambulatory Visit (INDEPENDENT_AMBULATORY_CARE_PROVIDER_SITE_OTHER): Payer: 59 | Admitting: Physician Assistant

## 2012-12-04 VITALS — BP 128/82 | HR 95 | Temp 98.7°F | Resp 17 | Ht 68.0 in | Wt 202.0 lb

## 2012-12-04 DIAGNOSIS — L259 Unspecified contact dermatitis, unspecified cause: Secondary | ICD-10-CM

## 2012-12-04 DIAGNOSIS — L309 Dermatitis, unspecified: Secondary | ICD-10-CM

## 2012-12-04 DIAGNOSIS — L299 Pruritus, unspecified: Secondary | ICD-10-CM

## 2012-12-04 DIAGNOSIS — J069 Acute upper respiratory infection, unspecified: Secondary | ICD-10-CM

## 2012-12-04 MED ORDER — GUAIFENESIN ER 1200 MG PO TB12: 1.0000 | ORAL_TABLET | Freq: Two times a day (BID) | ORAL | Status: DC | PRN

## 2012-12-04 MED ORDER — CLOBETASOL PROPIONATE 0.05 % EX CREA: TOPICAL_CREAM | Freq: Two times a day (BID) | CUTANEOUS | Status: DC

## 2012-12-04 MED ORDER — IPRATROPIUM BROMIDE 0.03 % NA SOLN: 2.0000 | Freq: Two times a day (BID) | NASAL | Status: DC

## 2012-12-04 MED ORDER — BENZONATATE 100 MG PO CAPS: 100.0000 mg | ORAL_CAPSULE | Freq: Three times a day (TID) | ORAL | Status: DC | PRN

## 2012-12-04 MED ORDER — PREDNISONE 20 MG PO TABS: ORAL_TABLET | ORAL | Status: DC

## 2012-12-04 NOTE — Progress Notes (Signed)
  Subjective:    Patient ID: Anthony Farley, male    DOB: February 23, 1976, 37 y.o.   MRN: [id number]  HPI  Anthony Farley is a 37 year old male smoker who is here today for a rash on his lower legs and a cold. The rash first appeared about 2.5 weeks ago. He went to [facility] and was told it was chigger bites. They gave him 5-day prednisone taper and triamcinolone cream. There was not much cream so it finished after a few days. After the prednisone taper, the rash was bit better but not resolved and still itching. He went back to [facility] and they gave him hydroxyzine for the itch. He start using a topical cream that his wife had from the derm. Last night, the rash had spread all the way up his right leg to his buttocks. The rash is red and warm, raised, and slightly different than the first chigger bites. He is still itching a lot.  He has tried every home remedy that he can think of.   Anthony Farley also complains of a cold that started Friday. He complains of head tightness, some productive cough, fatigue and SOB, nasal congestion down back of throat and post nasal drip over the weekend. Taking mucinex that is helping some. He denies fever, chills, and myalgias. NO history of asthma.   Review of Systems    as above Objective:   Physical Exam  Constitutional: He appears well-developed and well-nourished.  HENT:  Head: Normocephalic and atraumatic.  Right Ear: Tympanic membrane, external ear and ear canal normal.  Left Ear: Tympanic membrane, external ear and ear canal normal.  Nose: Rhinorrhea present. No mucosal edema. Right sinus exhibits no maxillary sinus tenderness and no frontal sinus tenderness. Left sinus exhibits no maxillary sinus tenderness and no frontal sinus tenderness.  Mouth/Throat: Uvula is midline, oropharynx is clear and moist and mucous membranes are normal.  Eyes: Conjunctivae are normal. Right eye exhibits no discharge. Left eye exhibits no discharge.  Cardiovascular: Normal rate, regular rhythm, S1  normal and S2 normal.   Pulmonary/Chest: Effort normal. No accessory muscle usage. He has no decreased breath sounds. Wheezes: faint wheeze that clears with cough.  Lymphadenopathy:    He has no cervical adenopathy.  Skin: Rash (maculopapular rash with discrete lesions on lower leg, many of which are excoriated. Behind leg and upper thigh to buttock of right leg, the rash becomes confluent. Warm to touch. Purplish coloration to rash on upper gluteus that appears to be resolving) noted. Rash is not pustular and not urticarial. He is not diaphoretic.         Assessment & Plan:  Dermatitis - Plan: clobetasol cream (TEMOVATE) 0.05 %, predniSONE (DELTASONE) 20 MG tablet  Itching - Plan: clobetasol cream (TEMOVATE) 0.05 %, predniSONE (DELTASONE) 20 MG tablet  Viral URI with cough - Plan: benzonatate (TESSALON) 100 MG capsule, Guaifenesin (MUCINEX MAXIMUM STRENGTH) 1200 MG TB12, ipratropium (ATROVENT) 0.03 % nasal spray  Try to avoid being overheated and dry off well after bathing. Wear loose fitting clothes and layers to be able to cool off quickly. Use the clobetasol cream over affected areas of the rash. Take prednisone taper for rash.  Use Guaifenesin and Atrovent to relieve nasal/sinus congestion. Use tessalon pearls as needed for cough.

## 2012-12-04 NOTE — Patient Instructions (Signed)
Drink plenty of fluids and rest. Use Tessalon pearls as needed for cough. Continue using Mucinex for sinus pressure. Use Atrovent nasal spray to help thin out mucus.   For rash, take 9-day prednisone taper. Use clobetasol cream over affected areas. Work on staying cool and dry, dress in loose fitting and light clothings. Getting hot or taking warm showers will make itching worse. Wear layers so you can cool off quickly.

## 2012-12-05 NOTE — Progress Notes (Signed)
I have examined this patient along with the student and agree.  

## 2012-12-31 ENCOUNTER — Other Ambulatory Visit: Payer: Self-pay | Admitting: Physician Assistant

## 2013-01-01 NOTE — Telephone Encounter (Signed)
Chelle, you Rx'd this for an acute issue, but do you want to give pt any RFs?

## 2013-01-03 NOTE — Telephone Encounter (Signed)
I authorized one more bottle.  If he still needs it, he should RTC.

## 2013-02-10 ENCOUNTER — Encounter (HOSPITAL_COMMUNITY): Payer: Self-pay | Admitting: Emergency Medicine

## 2013-02-10 ENCOUNTER — Emergency Department (HOSPITAL_COMMUNITY): Payer: 59

## 2013-02-10 ENCOUNTER — Emergency Department (HOSPITAL_COMMUNITY)
Admission: EM | Admit: 2013-02-10 | Discharge: 2013-02-10 | Disposition: A | Discharge status: Home / Self Care | Payer: 59 | Attending: Emergency Medicine | Admitting: Emergency Medicine

## 2013-02-10 DIAGNOSIS — F17200 Nicotine dependence, unspecified, uncomplicated: Secondary | ICD-10-CM | POA: Insufficient documentation

## 2013-02-10 DIAGNOSIS — Z79899 Other long term (current) drug therapy: Secondary | ICD-10-CM | POA: Insufficient documentation

## 2013-02-10 DIAGNOSIS — Z872 Personal history of diseases of the skin and subcutaneous tissue: Secondary | ICD-10-CM | POA: Insufficient documentation

## 2013-02-10 DIAGNOSIS — N200 Calculus of kidney: Secondary | ICD-10-CM | POA: Insufficient documentation

## 2013-02-10 DIAGNOSIS — K219 Gastro-esophageal reflux disease without esophagitis: Secondary | ICD-10-CM | POA: Insufficient documentation

## 2013-02-10 LAB — URINALYSIS, DIPSTICK ONLY
Bilirubin Urine: NEGATIVE
Glucose, UA: NEGATIVE mg/dL
Leukocytes, UA: NEGATIVE
Nitrite: NEGATIVE
Specific Gravity, Urine: 1.028 (ref 1.005–1.030)
Urobilinogen, UA: 1 mg/dL (ref 0.0–1.0)
pH: 6.5 (ref 5.0–8.0)

## 2013-02-10 LAB — COMPREHENSIVE METABOLIC PANEL
AST: 23 U/L (ref 0–37)
Albumin: 4.5 g/dL (ref 3.5–5.2)
BUN: 17 mg/dL (ref 6–23)
Calcium: 10 mg/dL (ref 8.4–10.5)
Chloride: 103 mEq/L (ref 96–112)
Creatinine, Ser: 1.34 mg/dL (ref 0.50–1.35)
GFR calc non Af Amer: 65 mL/min — ABNORMAL LOW (ref >90)
Sodium: 139 mEq/L (ref 135–145)
Total Bilirubin: 0.5 mg/dL (ref 0.3–1.2)

## 2013-02-10 LAB — CBC
HCT: 48.1 % (ref 39.0–52.0)
MCV: 86.2 fL (ref 78.0–100.0)
RDW: 13 % (ref 11.5–15.5)
WBC: 16.5 10*3/uL — ABNORMAL HIGH (ref 4.0–10.5)

## 2013-02-10 LAB — LIPASE, BLOOD: Lipase: 27 U/L (ref 11–59)

## 2013-02-10 MED ORDER — OXYCODONE-ACETAMINOPHEN 7.5-325 MG PO TABS: 1.0000 | ORAL_TABLET | Freq: Four times a day (QID) | ORAL | Status: DC | PRN

## 2013-02-10 MED ORDER — MORPHINE SULFATE 2 MG/ML IJ SOLN
2.0000 mg | Freq: Once | INTRAMUSCULAR | Status: AC
  Administered 2013-02-10: 2 mg via INTRAVENOUS
  Filled 2013-02-10: qty 1

## 2013-02-10 MED ORDER — IOHEXOL 300 MG/ML  SOLN
100.0000 mL | Freq: Once | INTRAMUSCULAR | Status: AC | PRN
  Administered 2013-02-10: 100 mL via INTRAVENOUS

## 2013-02-10 MED ORDER — MORPHINE SULFATE 4 MG/ML IJ SOLN
4.0000 mg | Freq: Once | INTRAMUSCULAR | Status: AC
  Administered 2013-02-10: 4 mg via INTRAVENOUS
  Filled 2013-02-10: qty 1

## 2013-02-10 MED ORDER — IBUPROFEN 800 MG PO TABS: 800.0000 mg | ORAL_TABLET | Freq: Three times a day (TID) | ORAL | Status: DC | PRN

## 2013-02-10 MED ORDER — HYDROMORPHONE HCL PF 1 MG/ML IJ SOLN
1.0000 mg | Freq: Once | INTRAMUSCULAR | Status: AC
  Administered 2013-02-10: 1 mg via INTRAVENOUS
  Filled 2013-02-10: qty 1

## 2013-02-10 MED ORDER — KETOROLAC TROMETHAMINE 30 MG/ML IJ SOLN
30.0000 mg | Freq: Once | INTRAMUSCULAR | Status: AC
  Administered 2013-02-10: 30 mg via INTRAVENOUS
  Filled 2013-02-10: qty 1

## 2013-02-10 MED ORDER — TAMSULOSIN HCL 0.4 MG PO CAPS: 0.4000 mg | ORAL_CAPSULE | Freq: Every day | ORAL | Status: DC

## 2013-02-10 MED ORDER — IOHEXOL 300 MG/ML  SOLN
50.0000 mL | Freq: Once | INTRAMUSCULAR | Status: AC | PRN
  Administered 2013-02-10: 50 mL via ORAL

## 2013-02-10 MED ORDER — MORPHINE SULFATE 4 MG/ML IJ SOLN: 4.0000 mg | Freq: Once | INTRAMUSCULAR | Status: DC

## 2013-02-10 NOTE — ED Notes (Signed)
Patient will call when he has to urinate 

## 2013-02-10 NOTE — ED Notes (Addendum)
Pt reports diffuse abdominal pain without radiation; onset between 0600 and 0730 this morning. Pt had a BM at work and had some pain, then tried to have another BM thinking it was related to this, but did not pass more stool. Pain continued; pt came to ED. Pt has no hx of this type of pain.   Addendum: Pt states hx of inguinal hernia.

## 2013-02-10 NOTE — ED Provider Notes (Signed)
CSN: [id number]     Arrival date & time 02/10/13  0759 History   First MD Initiated Contact with Patient 02/10/13 0804     No chief complaint on file.  (Consider location/radiation/quality/duration/timing/severity/associated sxs/prior Treatment) HPI Comments: Anthony Farley is a 37 year old male with a PMH of GERD and pilonidal cyst presenting with a 3 hour historyof lower abdominal pain.  He had a normal morning and went in early to work.  He had a normal bowel movement at work but felt as if he needed to have another one and then began to experience lower abdominal pain.  He describes the pain as 9/10, constant and non-radiating.  He denies N/V, fevers/chills, diarrhea, constipation, dysuria, hematuria or back pain.  The pain is not affected by position change.  He has not taken any medication to try and relieve the pain.     Past Medical History  Diagnosis Date  . GERD (gastroesophageal reflux disease)   . Abscess of buttock   . Scrotal abscess    Past Surgical History  Procedure Laterality Date  . Pilonidal cyst excision    . Pilonidal cyst drainage    . Mass excision  11/18/2011    Procedure: MINOR EXCISION OF MASS;  Surgeon: Ernestene Mention, MD;  Location: [facility];  Service: General;  Laterality: N/A;  excise scrotal mass   Family History  Problem Relation Age of Onset  . Heart disease Father   . Diabetes Paternal Grandmother    History  Substance Use Topics  . Smoking status: Current Every Day Smoker -- 1.00 packs/day for 22 years    Types: Cigarettes  . Smokeless tobacco: Not on file  . Alcohol Use: No    Review of Systems  Constitutional: Negative for fever and chills.  HENT: Negative for rhinorrhea and sore throat.   Respiratory: Negative for shortness of breath.   Cardiovascular: Negative for chest pain.  Gastrointestinal: Positive for abdominal pain. Negative for nausea, vomiting, diarrhea and constipation.  Genitourinary: Negative for dysuria and  hematuria.  Musculoskeletal: Negative for back pain.    Allergies  Review of patient's allergies indicates no known allergies.  Home Medications   Current Outpatient Rx  Name  Route  Sig  Dispense  Refill  . esomeprazole (NEXIUM) 40 MG capsule   Oral   Take 40 mg by mouth daily at 12 noon.          BP 151/93  Pulse 97  Temp(Src) 97.3 F (36.3 C) (Oral)  Resp 14  Ht 5\' 8"  (1.727 m)  Wt 205 lb (92.987 kg)  BMI 31.18 kg/m2  SpO2 99% Physical Exam  Constitutional: He is oriented to person, place, and time. He appears well-developed and well-nourished. He appears distressed.  HENT:  Head: Normocephalic and atraumatic.  Mouth/Throat: No oropharyngeal exudate.  Eyes: Pupils are equal, round, and reactive to light. No scleral icterus.  Neck: Neck supple.  Cardiovascular: Normal rate, regular rhythm and normal heart sounds.   Pulmonary/Chest: Effort normal and breath sounds normal. No respiratory distress. He has no wheezes. He has no rales.  Abdominal: Soft. Bowel sounds are normal. He exhibits no distension. There is no rebound and no guarding.  + lower abdomen tender to light palpation  No CVAT  Musculoskeletal: Normal range of motion.  Neurological: He is alert and oriented to person, place, and time.  Skin: He is not diaphoretic.  Psychiatric: He has a normal mood and affect. His behavior is normal.  ED Course  Procedures (including critical care time) Labs Review Labs Reviewed  CBC  COMPREHENSIVE METABOLIC PANEL  LIPASE, BLOOD  URINALYSIS, DIPSTICK ONLY   Imaging Review No results found.  EKG Interpretation   None       MDM  37 year old male with a PMH of GERD and pilonidal cysts presenting with lower abdominal pain.  Differential includes appendicitis, kidney stones, diverticular disease.  Will obtain CBC, CMP, lipase, UA.  Morphine for pain control.  Morphine did not help his pain.  Gave 1mg  Dilaudid, then 30mg  Toradol which relieved his pain.  +  leukocytosis; no hematuria; 3mm stone in L distal ureter and mild left hydronephrosis and hydroureter; Urology consulted for recommendations for follow-up.  Urology advised that patient can be discharged with Flomax and the stone will likely pass on its own.  They suggest he follow-up with [facility].    The patient has been instructed to take Flomax until the stone passes.  He has been provided with Percocet and Ibuprofen.  His Ca level was on the high end of normal (10) so I will obtain a PTH and patient has agreed to follow-up with a primary care doctor regarding this.  Anthony Peat, DO 02/10/13 [phone]

## 2013-02-11 LAB — URINE CULTURE

## 2013-02-11 LAB — PARATHYROID HORMONE, INTACT (NO CA): PTH: 73.3 pg/mL — ABNORMAL HIGH (ref 14.0–72.0)

## 2013-02-14 NOTE — ED Provider Notes (Signed)
I saw and evaluated the patient, reviewed the resident's note and I agree with the findings and plan.   .Face to face Exam:  General:  Awake HEENT:  Atraumatic Resp:  Normal effort Abd:  Nondistended Neuro:No focal weakness  Nelia Shi, MD 02/14/13 1303

## 2013-03-13 ENCOUNTER — Encounter (INDEPENDENT_AMBULATORY_CARE_PROVIDER_SITE_OTHER): Payer: 59 | Admitting: Surgery

## 2013-03-13 ENCOUNTER — Telehealth (INDEPENDENT_AMBULATORY_CARE_PROVIDER_SITE_OTHER): Payer: Self-pay

## 2013-03-13 NOTE — Telephone Encounter (Signed)
LMOM re; missed appt.\ Notified Dr Dahlstedt's office pt failed to keep appt today.

## 2013-03-19 ENCOUNTER — Encounter (INDEPENDENT_AMBULATORY_CARE_PROVIDER_SITE_OTHER): Payer: Self-pay | Admitting: Surgery

## 2013-11-20 ENCOUNTER — Encounter: Payer: Self-pay | Admitting: Gastroenterology

## 2014-01-07 ENCOUNTER — Institutional Professional Consult (permissible substitution): Payer: 59 | Admitting: Pulmonary Disease

## 2014-01-20 ENCOUNTER — Ambulatory Visit: Payer: 59 | Admitting: Gastroenterology

## 2014-02-14 ENCOUNTER — Institutional Professional Consult (permissible substitution): Payer: 59 | Admitting: Pulmonary Disease

## 2014-02-18 ENCOUNTER — Encounter: Payer: Self-pay | Admitting: Pulmonary Disease

## 2016-04-18 DIAGNOSIS — F17210 Nicotine dependence, cigarettes, uncomplicated: Secondary | ICD-10-CM | POA: Insufficient documentation

## 2016-04-18 DIAGNOSIS — Z8249 Family history of ischemic heart disease and other diseases of the circulatory system: Secondary | ICD-10-CM | POA: Insufficient documentation

## 2016-04-18 DIAGNOSIS — K2100 Gastro-esophageal reflux disease with esophagitis, without bleeding: Secondary | ICD-10-CM | POA: Insufficient documentation

## 2016-06-13 HISTORY — PX: COLONOSCOPY WITH ESOPHAGOGASTRODUODENOSCOPY (EGD): SHX5779

## 2017-09-20 DIAGNOSIS — N400 Enlarged prostate without lower urinary tract symptoms: Secondary | ICD-10-CM | POA: Insufficient documentation

## 2017-09-20 DIAGNOSIS — E669 Obesity, unspecified: Secondary | ICD-10-CM | POA: Insufficient documentation

## 2018-08-24 ENCOUNTER — Encounter (HOSPITAL_COMMUNITY): Payer: Self-pay | Admitting: Emergency Medicine

## 2018-08-24 ENCOUNTER — Other Ambulatory Visit: Payer: Self-pay

## 2018-08-24 ENCOUNTER — Emergency Department (HOSPITAL_COMMUNITY): Payer: 59

## 2018-08-24 ENCOUNTER — Emergency Department (HOSPITAL_COMMUNITY)
Admission: EM | Admit: 2018-08-24 | Discharge: 2018-08-24 | Disposition: A | Discharge status: Home / Self Care | Payer: 59 | Attending: Emergency Medicine | Admitting: Emergency Medicine

## 2018-08-24 DIAGNOSIS — F17210 Nicotine dependence, cigarettes, uncomplicated: Secondary | ICD-10-CM | POA: Insufficient documentation

## 2018-08-24 DIAGNOSIS — N200 Calculus of kidney: Secondary | ICD-10-CM | POA: Diagnosis not present

## 2018-08-24 DIAGNOSIS — R1032 Left lower quadrant pain: Secondary | ICD-10-CM | POA: Diagnosis present

## 2018-08-24 LAB — URINALYSIS, ROUTINE W REFLEX MICROSCOPIC
Bacteria, UA: NONE SEEN
Bilirubin Urine: NEGATIVE
Glucose, UA: NEGATIVE mg/dL
Ketones, ur: 5 mg/dL — AB
Leukocytes,Ua: NEGATIVE
Nitrite: NEGATIVE
Protein, ur: NEGATIVE mg/dL
Specific Gravity, Urine: 1.024 (ref 1.005–1.030)
pH: 5 (ref 5.0–8.0)

## 2018-08-24 LAB — CBC WITH DIFFERENTIAL/PLATELET
Abs Immature Granulocytes: 0.08 10*3/uL — ABNORMAL HIGH (ref 0.00–0.07)
Basophils Absolute: 0.1 10*3/uL (ref 0.0–0.1)
Basophils Relative: 0 %
Eosinophils Absolute: 0 10*3/uL (ref 0.0–0.5)
Eosinophils Relative: 0 %
HCT: 51.6 % (ref 39.0–52.0)
Hemoglobin: 16.5 g/dL (ref 13.0–17.0)
Immature Granulocytes: 1 %
Lymphocytes Relative: 7 %
Lymphs Abs: 1 10*3/uL (ref 0.7–4.0)
MCH: 28.7 pg (ref 26.0–34.0)
MCHC: 32 g/dL (ref 30.0–36.0)
MCV: 89.9 fL (ref 80.0–100.0)
Monocytes Absolute: 1 10*3/uL (ref 0.1–1.0)
Monocytes Relative: 7 %
Neutro Abs: 12 10*3/uL — ABNORMAL HIGH (ref 1.7–7.7)
Neutrophils Relative %: 85 %
Platelets: 212 10*3/uL (ref 150–400)
RBC: 5.74 MIL/uL (ref 4.22–5.81)
RDW: 13 % (ref 11.5–15.5)
WBC: 14.1 10*3/uL — ABNORMAL HIGH (ref 4.0–10.5)
nRBC: 0 % (ref 0.0–0.2)

## 2018-08-24 LAB — COMPREHENSIVE METABOLIC PANEL
ALT: 23 U/L (ref 0–44)
AST: 28 U/L (ref 15–41)
Albumin: 4.4 g/dL (ref 3.5–5.0)
Alkaline Phosphatase: 61 U/L (ref 38–126)
Anion gap: 10 (ref 5–15)
BUN: 18 mg/dL (ref 6–20)
CO2: 22 mmol/L (ref 22–32)
Calcium: 9.2 mg/dL (ref 8.9–10.3)
Chloride: 106 mmol/L (ref 98–111)
Creatinine, Ser: 1.66 mg/dL — ABNORMAL HIGH (ref 0.61–1.24)
GFR calc Af Amer: 57 mL/min — ABNORMAL LOW (ref >60)
GFR calc non Af Amer: 49 mL/min — ABNORMAL LOW (ref >60)
Glucose, Bld: 98 mg/dL (ref 70–99)
Potassium: 4.2 mmol/L (ref 3.5–5.1)
Sodium: 138 mmol/L (ref 135–145)
Total Bilirubin: 0.7 mg/dL (ref 0.3–1.2)
Total Protein: 7.3 g/dL (ref 6.5–8.1)

## 2018-08-24 MED ORDER — OXYCODONE HCL 5 MG PO TABS: 5.0000 mg | ORAL_TABLET | ORAL | 0 refills | Status: DC | PRN

## 2018-08-24 MED ORDER — SODIUM CHLORIDE 0.9 % IV BOLUS
1000.0000 mL | Freq: Once | INTRAVENOUS | Status: AC
Start: 2018-08-24 — End: 2018-08-24
  Administered 2018-08-24: 1000 mL via INTRAVENOUS

## 2018-08-24 MED ORDER — KETOROLAC TROMETHAMINE 30 MG/ML IJ SOLN
30.0000 mg | Freq: Once | INTRAMUSCULAR | Status: AC
  Administered 2018-08-24: 30 mg via INTRAVENOUS
  Filled 2018-08-24: qty 1

## 2018-08-24 MED ORDER — ONDANSETRON 4 MG PO TBDP: 4.0000 mg | ORAL_TABLET | Freq: Three times a day (TID) | ORAL | 0 refills | Status: DC | PRN

## 2018-08-24 MED ORDER — ONDANSETRON HCL 4 MG/2ML IJ SOLN
4.0000 mg | Freq: Once | INTRAMUSCULAR | Status: AC
  Administered 2018-08-24: 4 mg via INTRAVENOUS
  Filled 2018-08-24: qty 2

## 2018-08-24 NOTE — ED Notes (Signed)
Patient's bladder scan was repeated x2 for accuracy, showed 0 mL of urine in bladder. Patient stated he had voided x2 since Toradol and was feeling much better. MD notified.

## 2018-08-24 NOTE — ED Triage Notes (Signed)
Pt c/o lower abd pains that radiates to testicles starting this morning. Also hasnt been able to urinate since this morning. Hx kidney stones.

## 2018-08-24 NOTE — Discharge Instructions (Addendum)
Take Tylenol 1000 mg 4 times a day for 1 week. This is the maximum dose of Tylenol usually take from all sources. Please check other over-the-counter medications and prescriptions to ensure you are not taking other medications that contain acetaminophen.  Take oxycodone as needed for breakthrough pain.  This medication can be addicting, sedating and cause constipation.

## 2018-08-24 NOTE — ED Notes (Signed)
Bladder scan= 0 ml all 3 attempts

## 2018-08-24 NOTE — ED Provider Notes (Signed)
[facility]-EMERGENCY DEPT Provider Note   CSN: [id number] Arrival date & time: 08/24/18  1436    History   Chief Complaint Chief Complaint  Patient presents with   Abdominal Pain   Groin Swelling   Urinary Retention    HPI Anthony Farley is a 43 y.o. male.     HPI   Lower abdomen, radiating into scrotum and left flank Started this AM No testicular trauma, no fevers Woke up feeling this lower abdominal pain, feels like prior kidney stones Pain now severe, waxing and waning, got medication from neighbor which initially helped No dysuria, no hematuria Hx of enlarged prostate, takes flomax Difficulty urinating today  Past Medical History:  Diagnosis Date   Abscess of buttock    GERD (gastroesophageal reflux disease)    Scrotal abscess     Patient Active Problem List   Diagnosis Date Noted   Pilonidal cyst with abscess 02/07/2012    Past Surgical History:  Procedure Laterality Date   MASS EXCISION  11/18/2011   Procedure: MINOR EXCISION OF MASS;  Surgeon: Ernestene MentionHaywood M Ingram, MD;  Location: [facility];  Service: General;  Laterality: N/A;  excise scrotal mass   PILONIDAL CYST DRAINAGE     PILONIDAL CYST EXCISION          Home Medications    Prior to Admission medications   Medication Sig Start Date End Date Taking? Authorizing Provider  RABEprazole (ACIPHEX) 20 MG tablet Take 20 mg by mouth daily. 06/14/18  Yes [provider]  ranitidine (ZANTAC) 150 MG tablet Take 150 mg by mouth daily as needed for heartburn.   Yes [provider]  tamsulosin (FLOMAX) 0.4 MG CAPS capsule Take 1 capsule (0.4 mg total) by mouth daily. 02/10/13  Yes Yolanda MangesWilson, Alex M, DO  ondansetron (ZOFRAN ODT) 4 MG disintegrating tablet Take 1 tablet (4 mg total) by mouth every 8 (eight) hours as needed for nausea or vomiting. 08/24/18   Alvira MondaySchlossman, Windsor Zirkelbach, MD  oxyCODONE (ROXICODONE) 5 MG immediate release tablet Take 1 tablet (5 mg total)  by mouth every 4 (four) hours as needed for severe pain. 08/24/18   Alvira MondaySchlossman, Reeve Turnley, MD    Family History Family History  Problem Relation Age of Onset   Heart disease Father    Diabetes Paternal Grandmother     Social History Social History   Tobacco Use   Smoking status: Current Every Day Smoker    Packs/day: 1.00    Years: 22.00    Pack years: 22.00    Types: Cigarettes   Smokeless tobacco: Never Used  Substance Use Topics   Alcohol use: No   Drug use: No     Allergies   Patient has no known allergies.   Review of Systems Review of Systems  Constitutional: Negative for fever.  HENT: Negative for sore throat.   Eyes: Negative for visual disturbance.  Respiratory: Negative for shortness of breath.   Cardiovascular: Negative for chest pain.  Gastrointestinal: Positive for abdominal pain and nausea. Negative for constipation, diarrhea and vomiting.  Genitourinary: Positive for difficulty urinating. Negative for dysuria and urgency.  Musculoskeletal: Negative for back pain and neck stiffness.  Skin: Negative for rash.  Neurological: Negative for syncope and headaches.     Physical Exam Updated Vital Signs BP 128/84 (BP Location: Left Arm)    Pulse (!) 58    Temp 98.5 F (36.9 C) (Oral)    Resp 16    SpO2 100%   Physical  Exam Vitals signs and nursing note reviewed.  Constitutional:      General: He is not in acute distress.    Appearance: He is well-developed. He is not diaphoretic.  HENT:     Head: Normocephalic and atraumatic.  Eyes:     Conjunctiva/sclera: Conjunctivae normal.  Neck:     Musculoskeletal: Normal range of motion.  Cardiovascular:     Rate and Rhythm: Normal rate and regular rhythm.     Heart sounds: Normal heart sounds. No murmur. No friction rub. No gallop.   Pulmonary:     Effort: Pulmonary effort is normal. No respiratory distress.     Breath sounds: Normal breath sounds. No wheezing or rales.  Abdominal:     General: There  is no distension.     Palpations: Abdomen is soft.     Tenderness: There is abdominal tenderness in the suprapubic area. There is left CVA tenderness. There is no guarding.  Skin:    General: Skin is warm and dry.  Neurological:     Mental Status: He is alert and oriented to person, place, and time.      ED Treatments / Results  Labs (all labs ordered are listed, but only abnormal results are displayed) Labs Reviewed  CBC WITH DIFFERENTIAL/PLATELET - Abnormal; Notable for the following components:      Result Value   WBC 14.1 (*)    Neutro Abs 12.0 (*)    Abs Immature Granulocytes 0.08 (*)    All other components within normal limits  COMPREHENSIVE METABOLIC PANEL - Abnormal; Notable for the following components:   Creatinine, Ser 1.66 (*)    GFR calc non Af Amer 49 (*)    GFR calc Af Amer 57 (*)    All other components within normal limits  URINALYSIS, ROUTINE W REFLEX MICROSCOPIC - Abnormal; Notable for the following components:   Hgb urine dipstick SMALL (*)    Ketones, ur 5 (*)    All other components within normal limits  URINE CULTURE    EKG None  Radiology Ct Renal Stone Study  Result Date: 08/24/2018 CLINICAL DATA:  Low abdominal pain radiating to the testicles since this morning. Unable to urinate since this morning. History of kidney stones. EXAM: CT ABDOMEN AND PELVIS WITHOUT CONTRAST TECHNIQUE: Multidetector CT imaging of the abdomen and pelvis was performed following the standard protocol without IV contrast. COMPARISON:  02/10/2013. FINDINGS: Lower chest: Minimal bibasilar linear atelectasis or scarring. Hepatobiliary: No focal liver abnormality is seen. No gallstones, gallbladder wall thickening, or biliary dilatation. Pancreas: Unremarkable. No pancreatic ductal dilatation or surrounding inflammatory changes. Spleen: Normal in size without focal abnormality. Adrenals/Urinary Tract: The previously demonstrated 1 cm upper pole right renal cyst currently measures  2.1 cm with noncontrast features of a simple cyst. Interval diffuse enlargement of the left kidney with perinephric soft tissue stranding and mild dilatation of the left renal collecting system and ureter to the level of a 3 mm distal left ureteral calculus at the ureterovesical junction. This is not visible on the scout image. A nearby tiny phlebolith is unchanged. Unremarkable urinary bladder and right ureter. Normal appearing adrenal glands. Stomach/Bowel: Stomach is within normal limits. Appendix appears normal. No evidence of bowel wall thickening, distention, or inflammatory changes. Vascular/Lymphatic: No significant vascular findings are present. No enlarged abdominal or pelvic lymph nodes. Reproductive: Moderately enlarged prostate gland. Other: Small to moderate-sized umbilical hernia containing fat. Musculoskeletal: Mild lower thoracic spine degenerative changes. IMPRESSION: 1. 3 mm distal left ureteral calculus  at the ureterovesical junction, causing mild left hydronephrosis and hydroureter. 2. Moderate prostatic hypertrophy. Electronically Signed   By: Claudie Revering M.D.   On: 08/24/2018 17:09    Procedures Procedures (including critical care time)  Medications Ordered in ED Medications  sodium chloride 0.9 % bolus 1,000 mL (0 mLs Intravenous Stopped 08/24/18 1744)  ketorolac (TORADOL) 30 MG/ML injection 30 mg (30 mg Intravenous Given 08/24/18 1626)  ondansetron (ZOFRAN) injection 4 mg (4 mg Intravenous Given 08/24/18 1626)     Initial Impression / Assessment and Plan / ED Course  I have reviewed the triage vital signs and the nursing notes.  Pertinent labs & imaging results that were available during my care of the patient were reviewed by me and considered in my medical decision making (see chart for details).        43 year old male with history of nephrolithiasis presents with concern for suprapubic and left flank pain as well as urinary retentive symptoms.    CT completed shows  3 mm obstructing UVJ stone with left-sided hydronephrosis.  No sign of urinary tract infection.  When patient first arrived to the emergency department, he had difficulty urinating, but reported improvement with improved pain after toradol.  Repeat bladder scan prior to discharge shows no urine left in the bladder and he has no sensation of needing to urinate.  Do suspect the pain from his kidney stones and potentially the medication he is given contributed to some retentive symptoms in the setting of known prostatic hypertrophy.  His creatinine is mildly elevated from prior value many years ago.  Recommend Tylenol for pain and given prescription for oxycodone after reviewing the [facility] drug database.  Discussed risks of the medication.  Recommend hydration, continuing to take his home Flomax, and follow-up with urology.  Discussed reasons to return to the emergency department in detail, including urinary retention, fevers or other symptoms. Patient discharged in stable condition with understanding of reasons to return.   Final Clinical Impressions(s) / ED Diagnoses   Final diagnoses:  Nephrolithiasis    ED Discharge Orders         Ordered    oxyCODONE (ROXICODONE) 5 MG immediate release tablet  Every 4 hours PRN     08/24/18 1908    ondansetron (ZOFRAN ODT) 4 MG disintegrating tablet  Every 8 hours PRN     08/24/18 1908           Gareth Morgan, MD 08/24/18 2302

## 2018-08-25 LAB — URINE CULTURE: Culture: NO GROWTH

## 2019-06-12 ENCOUNTER — Encounter (HOSPITAL_BASED_OUTPATIENT_CLINIC_OR_DEPARTMENT_OTHER): Payer: Self-pay | Admitting: General Surgery

## 2019-06-13 ENCOUNTER — Encounter (HOSPITAL_BASED_OUTPATIENT_CLINIC_OR_DEPARTMENT_OTHER): Payer: Self-pay | Admitting: General Surgery

## 2019-06-13 ENCOUNTER — Other Ambulatory Visit: Payer: Self-pay

## 2019-06-13 NOTE — Progress Notes (Addendum)
Spoke w/ via phone for pre-op interview--- PT Lab needs dos----  no            Lab results------ no COVID test ------ 06-15-2019 @ 0905 Arrive at ------- 0830 NPO after ------ MN Medications to take morning of surgery ----- NONE Diabetic medication ----- n/a Patient Special Instructions ----- n/a Pre-Op special Istructions ----- pre-op orders pending, sent dr Sheliah Hatch inbox message in epic Patient verbalized understanding of instructions that were given at this phone interview. Patient denies shortness of breath, chest pain, fever, cough a this phone interview.

## 2019-06-15 ENCOUNTER — Other Ambulatory Visit (HOSPITAL_COMMUNITY)
Admission: RE | Admit: 2019-06-15 | Discharge: 2019-06-15 | Disposition: A | Discharge status: Home / Self Care | Payer: 59 | Source: Ambulatory Visit | Attending: General Surgery | Admitting: General Surgery

## 2019-06-15 DIAGNOSIS — Z20822 Contact with and (suspected) exposure to covid-19: Secondary | ICD-10-CM | POA: Diagnosis not present

## 2019-06-15 DIAGNOSIS — Z01812 Encounter for preprocedural laboratory examination: Secondary | ICD-10-CM | POA: Insufficient documentation

## 2019-06-15 LAB — SARS CORONAVIRUS 2 (TAT 6-24 HRS): SARS Coronavirus 2: NEGATIVE

## 2019-06-18 ENCOUNTER — Ambulatory Visit: Payer: Self-pay | Admitting: General Surgery

## 2019-06-18 NOTE — Anesthesia Preprocedure Evaluation (Addendum)
Anesthesia Evaluation  Patient identified by MRN, date of birth, ID band Patient awake    Reviewed: Allergy & Precautions, NPO status , Patient's Chart, lab work & pertinent test results  Airway Mallampati: II  TM Distance: >3 FB Neck ROM: Full    Dental no notable dental hx.    Pulmonary Current SmokerPatient did not abstain from smoking.,    Pulmonary exam normal breath sounds clear to auscultation       Cardiovascular negative cardio ROS Normal cardiovascular exam Rhythm:Regular Rate:Normal     Neuro/Psych negative neurological ROS  negative psych ROS   GI/Hepatic Neg liver ROS, hiatal hernia, GERD  Medicated and Controlled,  Endo/Other  negative endocrine ROS  Renal/GU negative Renal ROS     Musculoskeletal negative musculoskeletal ROS (+)   Abdominal (+) + obese,   Peds  Hematology negative hematology ROS (+)   Anesthesia Other Findings SEBACEOUS CYST OF PERINEUM  Reproductive/Obstetrics                            Anesthesia Physical Anesthesia Plan  ASA: II  Anesthesia Plan: General   Post-op Pain Management:    Induction: Intravenous  PONV Risk Score and Plan: 1 and Ondansetron, Dexamethasone, Midazolam and Treatment may vary due to age or medical condition  Airway Management Planned: LMA  Additional Equipment:   Intra-op Plan:   Post-operative Plan: Extubation in OR  Informed Consent: I have reviewed the patients History and Physical, chart, labs and discussed the procedure including the risks, benefits and alternatives for the proposed anesthesia with the patient or authorized representative who has indicated his/her understanding and acceptance.     Dental advisory given  Plan Discussed with: CRNA  Anesthesia Plan Comments:        Anesthesia Quick Evaluation

## 2019-06-19 ENCOUNTER — Encounter (HOSPITAL_BASED_OUTPATIENT_CLINIC_OR_DEPARTMENT_OTHER): Payer: Self-pay | Admitting: General Surgery

## 2019-06-19 ENCOUNTER — Ambulatory Visit (HOSPITAL_BASED_OUTPATIENT_CLINIC_OR_DEPARTMENT_OTHER): Payer: 59 | Admitting: Anesthesiology

## 2019-06-19 ENCOUNTER — Encounter (HOSPITAL_BASED_OUTPATIENT_CLINIC_OR_DEPARTMENT_OTHER)
Admission: RE | Disposition: A | Discharge status: Home / Self Care | Payer: Self-pay | Source: Home / Self Care | Attending: General Surgery

## 2019-06-19 ENCOUNTER — Ambulatory Visit (HOSPITAL_BASED_OUTPATIENT_CLINIC_OR_DEPARTMENT_OTHER)
Admission: RE | Admit: 2019-06-19 | Discharge: 2019-06-19 | Disposition: A | Discharge status: Home / Self Care | Payer: 59 | Attending: General Surgery | Admitting: General Surgery

## 2019-06-19 DIAGNOSIS — K449 Diaphragmatic hernia without obstruction or gangrene: Secondary | ICD-10-CM | POA: Diagnosis not present

## 2019-06-19 DIAGNOSIS — N400 Enlarged prostate without lower urinary tract symptoms: Secondary | ICD-10-CM | POA: Diagnosis not present

## 2019-06-19 DIAGNOSIS — L729 Follicular cyst of the skin and subcutaneous tissue, unspecified: Secondary | ICD-10-CM | POA: Diagnosis present

## 2019-06-19 DIAGNOSIS — L720 Epidermal cyst: Secondary | ICD-10-CM | POA: Insufficient documentation

## 2019-06-19 DIAGNOSIS — F17210 Nicotine dependence, cigarettes, uncomplicated: Secondary | ICD-10-CM | POA: Diagnosis not present

## 2019-06-19 DIAGNOSIS — K219 Gastro-esophageal reflux disease without esophagitis: Secondary | ICD-10-CM | POA: Insufficient documentation

## 2019-06-19 HISTORY — DX: Benign prostatic hyperplasia without lower urinary tract symptoms: N40.0

## 2019-06-19 HISTORY — DX: Family history of sudden cardiac death: Z82.41

## 2019-06-19 HISTORY — DX: Sebaceous cyst: L72.3

## 2019-06-19 HISTORY — PX: EXCISION OF KELOID: SHX6267

## 2019-06-19 HISTORY — DX: Diaphragmatic hernia without obstruction or gangrene: K44.9

## 2019-06-19 HISTORY — DX: Personal history of urinary calculi: Z87.442

## 2019-06-19 SURGERY — EXCISION, KELOID
Anesthesia: General | Site: Perineum

## 2019-06-19 MED ORDER — KETOROLAC TROMETHAMINE 30 MG/ML IJ SOLN
30.0000 mg | Freq: Once | INTRAMUSCULAR | Status: DC | PRN
  Filled 2019-06-19: qty 1

## 2019-06-19 MED ORDER — IBUPROFEN 800 MG PO TABS: 800.0000 mg | ORAL_TABLET | Freq: Three times a day (TID) | ORAL | 0 refills | Status: DC | PRN

## 2019-06-19 MED ORDER — CEFAZOLIN SODIUM-DEXTROSE 2-4 GM/100ML-% IV SOLN
INTRAVENOUS | Status: AC
  Filled 2019-06-19: qty 100

## 2019-06-19 MED ORDER — DEXAMETHASONE SODIUM PHOSPHATE 10 MG/ML IJ SOLN
INTRAMUSCULAR | Status: DC | PRN
  Administered 2019-06-19: 10 mg via INTRAVENOUS

## 2019-06-19 MED ORDER — BUPIVACAINE-EPINEPHRINE 0.5% -1:200000 IJ SOLN
INTRAMUSCULAR | Status: DC | PRN
  Administered 2019-06-19: 30 mL

## 2019-06-19 MED ORDER — OXYCODONE HCL 5 MG PO TABS
5.0000 mg | ORAL_TABLET | Freq: Once | ORAL | Status: DC | PRN
  Filled 2019-06-19: qty 1

## 2019-06-19 MED ORDER — CHLORHEXIDINE GLUCONATE CLOTH 2 % EX PADS
6.0000 | MEDICATED_PAD | Freq: Once | CUTANEOUS | Status: DC
  Filled 2019-06-19: qty 6

## 2019-06-19 MED ORDER — PROMETHAZINE HCL 25 MG/ML IJ SOLN
6.2500 mg | INTRAMUSCULAR | Status: DC | PRN
  Filled 2019-06-19: qty 1

## 2019-06-19 MED ORDER — ACETAMINOPHEN 500 MG PO TABS
1000.0000 mg | ORAL_TABLET | ORAL | Status: AC
  Administered 2019-06-19: 09:00:00 1000 mg via ORAL
  Filled 2019-06-19: qty 2

## 2019-06-19 MED ORDER — PROPOFOL 10 MG/ML IV BOLUS
INTRAVENOUS | Status: AC
  Filled 2019-06-19: qty 40

## 2019-06-19 MED ORDER — ONDANSETRON HCL 4 MG/2ML IJ SOLN
INTRAMUSCULAR | Status: DC | PRN
  Administered 2019-06-19: 4 mg via INTRAVENOUS

## 2019-06-19 MED ORDER — LIDOCAINE 2% (20 MG/ML) 5 ML SYRINGE
INTRAMUSCULAR | Status: DC | PRN
  Administered 2019-06-19: 60 mg via INTRAVENOUS

## 2019-06-19 MED ORDER — DEXAMETHASONE SODIUM PHOSPHATE 10 MG/ML IJ SOLN
INTRAMUSCULAR | Status: AC
  Filled 2019-06-19: qty 1

## 2019-06-19 MED ORDER — LIDOCAINE 2% (20 MG/ML) 5 ML SYRINGE
INTRAMUSCULAR | Status: AC
  Filled 2019-06-19: qty 5

## 2019-06-19 MED ORDER — OXYCODONE HCL 5 MG PO TABS: 5.0000 mg | ORAL_TABLET | Freq: Four times a day (QID) | ORAL | 0 refills | Status: DC | PRN

## 2019-06-19 MED ORDER — CEFAZOLIN SODIUM-DEXTROSE 2-4 GM/100ML-% IV SOLN
2.0000 g | INTRAVENOUS | Status: AC
  Administered 2019-06-19: 10:00:00 2 g via INTRAVENOUS
  Filled 2019-06-19: qty 100

## 2019-06-19 MED ORDER — OXYCODONE HCL 5 MG/5ML PO SOLN
5.0000 mg | Freq: Once | ORAL | Status: DC | PRN
  Filled 2019-06-19: qty 5

## 2019-06-19 MED ORDER — GABAPENTIN 300 MG PO CAPS
ORAL_CAPSULE | ORAL | Status: AC
  Filled 2019-06-19: qty 1

## 2019-06-19 MED ORDER — FENTANYL CITRATE (PF) 100 MCG/2ML IJ SOLN
INTRAMUSCULAR | Status: AC
  Filled 2019-06-19: qty 2

## 2019-06-19 MED ORDER — MIDAZOLAM HCL 2 MG/2ML IJ SOLN
INTRAMUSCULAR | Status: DC | PRN
  Administered 2019-06-19 (×2): 1 mg via INTRAVENOUS

## 2019-06-19 MED ORDER — ACETAMINOPHEN 500 MG PO TABS
ORAL_TABLET | ORAL | Status: AC
  Filled 2019-06-19: qty 2

## 2019-06-19 MED ORDER — PROPOFOL 10 MG/ML IV BOLUS
INTRAVENOUS | Status: DC | PRN
  Administered 2019-06-19: 200 mg via INTRAVENOUS

## 2019-06-19 MED ORDER — LACTATED RINGERS IV SOLN
INTRAVENOUS | Status: DC
  Filled 2019-06-19: qty 1000

## 2019-06-19 MED ORDER — ARTIFICIAL TEARS OPHTHALMIC OINT
TOPICAL_OINTMENT | OPHTHALMIC | Status: AC
  Filled 2019-06-19: qty 3.5

## 2019-06-19 MED ORDER — KETOROLAC TROMETHAMINE 15 MG/ML IJ SOLN
15.0000 mg | INTRAMUSCULAR | Status: AC
  Administered 2019-06-19 (×2): 15 mg via INTRAVENOUS
  Filled 2019-06-19: qty 1

## 2019-06-19 MED ORDER — MIDAZOLAM HCL 2 MG/2ML IJ SOLN
INTRAMUSCULAR | Status: AC
  Filled 2019-06-19: qty 2

## 2019-06-19 MED ORDER — FENTANYL CITRATE (PF) 100 MCG/2ML IJ SOLN
INTRAMUSCULAR | Status: DC | PRN
  Administered 2019-06-19 (×4): 25 ug via INTRAVENOUS

## 2019-06-19 MED ORDER — ONDANSETRON HCL 4 MG/2ML IJ SOLN
INTRAMUSCULAR | Status: AC
  Filled 2019-06-19: qty 2

## 2019-06-19 MED ORDER — AMOXICILLIN-POT CLAVULANATE 875-125 MG PO TABS: 1.0000 | ORAL_TABLET | Freq: Two times a day (BID) | ORAL | 0 refills | Status: DC

## 2019-06-19 MED ORDER — HYDROMORPHONE HCL 1 MG/ML IJ SOLN
0.2500 mg | INTRAMUSCULAR | Status: DC | PRN
  Filled 2019-06-19: qty 0.5

## 2019-06-19 MED ORDER — GLYCOPYRROLATE PF 0.2 MG/ML IJ SOSY
PREFILLED_SYRINGE | INTRAMUSCULAR | Status: DC | PRN
  Administered 2019-06-19: .2 mg via INTRAVENOUS

## 2019-06-19 MED ORDER — KETOROLAC TROMETHAMINE 30 MG/ML IJ SOLN
INTRAMUSCULAR | Status: AC
  Filled 2019-06-19: qty 1

## 2019-06-19 MED ORDER — GABAPENTIN 300 MG PO CAPS
300.0000 mg | ORAL_CAPSULE | ORAL | Status: AC
  Administered 2019-06-19: 300 mg via ORAL
  Filled 2019-06-19: qty 1

## 2019-06-19 SURGICAL SUPPLY — 59 items
APL PRP STRL LF DISP 70% ISPRP (MISCELLANEOUS)
APL SKNCLS STERI-STRIP NONHPOA (GAUZE/BANDAGES/DRESSINGS)
BENZOIN TINCTURE PRP APPL 2/3 (GAUZE/BANDAGES/DRESSINGS) IMPLANT
BLADE CLIPPER SENSICLIP SURGIC (BLADE) IMPLANT
BLADE HEX COATED 2.75 (ELECTRODE) ×3 IMPLANT
BLADE SURG 15 STRL LF DISP TIS (BLADE) IMPLANT
BLADE SURG 15 STRL SS (BLADE) ×3
BNDG GAUZE ELAST 4 BULKY (GAUZE/BANDAGES/DRESSINGS) ×2 IMPLANT
CHLORAPREP W/TINT 26 (MISCELLANEOUS) IMPLANT
CLOSURE WOUND 1/2 X4 (GAUZE/BANDAGES/DRESSINGS)
COVER BACK TABLE 60X90IN (DRAPES) ×1 IMPLANT
COVER MAYO STAND STRL (DRAPES) IMPLANT
COVER WAND RF STERILE (DRAPES) ×3 IMPLANT
DECANTER SPIKE VIAL GLASS SM (MISCELLANEOUS) IMPLANT
DRAIN PENROSE 0.25X18 (DRAIN) IMPLANT
DRAIN PENROSE 0.5X18 (DRAIN) ×1 IMPLANT
DRAPE LAPAROTOMY 100X72 PEDS (DRAPES) IMPLANT
DRAPE UTILITY XL STRL (DRAPES) IMPLANT
DRSG TEGADERM 4X4.75 (GAUZE/BANDAGES/DRESSINGS) IMPLANT
ELECT REM PT RETURN 9FT ADLT (ELECTROSURGICAL) ×3
ELECTRODE REM PT RTRN 9FT ADLT (ELECTROSURGICAL) ×1 IMPLANT
GAUZE SPONGE 4X4 12PLY STRL (GAUZE/BANDAGES/DRESSINGS) ×2 IMPLANT
GAUZE XEROFORM 1X8 LF (GAUZE/BANDAGES/DRESSINGS) ×2 IMPLANT
GLOVE BIO SURGEON STRL SZ7 (GLOVE) ×2 IMPLANT
GLOVE BIOGEL PI IND STRL 7.0 (GLOVE) ×1 IMPLANT
GLOVE BIOGEL PI INDICATOR 7.0 (GLOVE) ×6
GLOVE SURG SS PI 7.0 STRL IVOR (GLOVE) ×3 IMPLANT
GOWN STRL REUS W/TWL LRG LVL3 (GOWN DISPOSABLE) ×5 IMPLANT
GOWN STRL REUS W/TWL XL LVL3 (GOWN DISPOSABLE) ×3 IMPLANT
KIT TURNOVER CYSTO (KITS) ×3 IMPLANT
NDL HYPO 25X1 1.5 SAFETY (NEEDLE) ×1 IMPLANT
NEEDLE HYPO 25X1 1.5 SAFETY (NEEDLE) ×3 IMPLANT
NS IRRIG 500ML POUR BTL (IV SOLUTION) ×2 IMPLANT
PACK BASIN DAY SURGERY FS (CUSTOM PROCEDURE TRAY) IMPLANT
PACK VAGINAL WOMENS (CUSTOM PROCEDURE TRAY) ×3 IMPLANT
PENCIL BUTTON HOLSTER BLD 10FT (ELECTRODE) ×2 IMPLANT
SPONGE LAP 4X18 RFD (DISPOSABLE) IMPLANT
STRIP CLOSURE SKIN 1/2X4 (GAUZE/BANDAGES/DRESSINGS) IMPLANT
SUCTION FRAZIER HANDLE 10FR (MISCELLANEOUS)
SUCTION TUBE FRAZIER 10FR DISP (MISCELLANEOUS) IMPLANT
SUPPORT SCROTAL LG STRP (MISCELLANEOUS) ×1 IMPLANT
SUPPORTER ATHLETIC LG (MISCELLANEOUS) ×1
SUT MNCRL AB 4-0 PS2 18 (SUTURE) ×2 IMPLANT
SUT SILK 3 0 TIES 17X18 (SUTURE)
SUT SILK 3-0 18XBRD TIE BLK (SUTURE) IMPLANT
SUT VIC AB 2-0 SH 27 (SUTURE)
SUT VIC AB 2-0 SH 27XBRD (SUTURE) IMPLANT
SUT VIC AB 3-0 SH 27 (SUTURE)
SUT VIC AB 3-0 SH 27X BRD (SUTURE) IMPLANT
SUT VIC AB 3-0 SH 8-18 (SUTURE) ×2 IMPLANT
SWAB COLLECTION DEVICE MRSA (MISCELLANEOUS) IMPLANT
SWAB CULTURE ESWAB REG 1ML (MISCELLANEOUS) IMPLANT
SYR BULB 3OZ (MISCELLANEOUS) ×2 IMPLANT
SYR BULB IRRIGATION 50ML (SYRINGE) IMPLANT
SYR CONTROL 10ML LL (SYRINGE) ×3 IMPLANT
TOWEL OR 17X26 10 PK STRL BLUE (TOWEL DISPOSABLE) ×3 IMPLANT
TUBE CONNECTING 12'X1/4 (SUCTIONS) ×1
TUBE CONNECTING 12X1/4 (SUCTIONS) ×1 IMPLANT
YANKAUER SUCT BULB TIP NO VENT (SUCTIONS) ×2 IMPLANT

## 2019-06-19 NOTE — H&P (Signed)
Anthony Farley is an 44 y.o. male.   Chief Complaint: cyst HPI: 44 yo male with recurrent infections of perineal cyst. He has also had pilonidal disease with multiple resection but with recurrence.  Past Medical History:  Diagnosis Date  . BPH (benign prostatic hyperplasia)   . BPH (benign prostatic hyperplasia)   . Family history of sudden cardiac death in father   . GERD (gastroesophageal reflux disease)   . Hiatal hernia   . History of kidney stones   . Sebaceous cyst    perineum    Past Surgical History:  Procedure Laterality Date  . COLONOSCOPY WITH ESOPHAGOGASTRODUODENOSCOPY (EGD)  06/13/2016  . MASS EXCISION  11/18/2011   Procedure: MINOR EXCISION OF MASS;  Surgeon: Ernestene Mention, MD;  Location: [facility];  Service: General;  Laterality: N/A;  excise scrotal mass  . PILONIDAL CYST EXCISION  x3  last one on 04-30-2004  @[facility]     Family History  Problem Relation Age of Onset  . Heart disease Father   . Diabetes Paternal Grandmother    Social History:  reports that he has been smoking cigarettes. He has a 27.00 pack-year smoking history. He has never used smokeless tobacco. He reports that he does not drink alcohol or use drugs.  Allergies: No Known Allergies  Medications Prior to Admission  Medication Sig Dispense Refill  . famotidine (PEPCID) 20 MG tablet Take 20 mg by mouth as needed for heartburn or indigestion.    . RABEprazole (ACIPHEX) 20 MG tablet Take 20 mg by mouth at bedtime.    . tamsulosin (FLOMAX) 0.4 MG CAPS capsule Take 0.4 mg by mouth at bedtime.      No results found for this or any previous visit (from the past 48 hour(s)). No results found.  Review of Systems  Constitutional: Negative for chills and fever.  HENT: Negative for hearing loss.   Respiratory: Negative for cough.   Cardiovascular: Negative for chest pain and palpitations.  Gastrointestinal: Negative for abdominal pain, nausea and vomiting.  Genitourinary: Negative for  dysuria and urgency.  Musculoskeletal: Negative for myalgias and neck pain.  Skin: Negative for rash.  Neurological: Negative for dizziness and headaches.  Hematological: Does not bruise/bleed easily.  Psychiatric/Behavioral: Negative for suicidal ideas.    Blood pressure 136/87, pulse 76, temperature 98.3 F (36.8 C), temperature source Oral, resp. rate 16, height 5\' 8"  (1.727 m), weight 93.8 kg, SpO2 100 %. Physical Exam  Vitals reviewed. Constitutional: He is oriented to person, place, and time. He appears well-developed and well-nourished.  HENT:  Head: Normocephalic and atraumatic.  Eyes: Pupils are equal, round, and reactive to light. Conjunctivae and EOM are normal.  Cardiovascular: Normal rate and regular rhythm.  Respiratory: Effort normal and breath sounds normal.  GI: Soft. Bowel sounds are normal. He exhibits no distension. There is no abdominal tenderness.  Genitourinary:    Genitourinary Comments: Left sided perineal cyst with small amount of drainage   Musculoskeletal:        General: Normal range of motion.     Cervical back: Normal range of motion and neck supple.  Neurological: He is alert and oriented to person, place, and time.  Skin: Skin is warm and dry.  Psychiatric: He has a normal mood and affect. His behavior is normal.    Assessment/Plan 44 yo male with cyst of perineum with recurrent infections -excision of cyst -planned outpatient procedure  , MD 06/19/2019, 10:12 AM

## 2019-06-19 NOTE — Transfer of Care (Signed)
  Last Vitals:  Vitals Value Taken Time  BP 128/81 06/19/19 1118  Temp 36.4 C 06/19/19 1118  Pulse 95 06/19/19 1119  Resp 14 06/19/19 1119  SpO2 97 % 06/19/19 1119  Vitals shown include unvalidated device data.  Last Pain:  Vitals:   06/19/19 0850  TempSrc: Oral  PainSc: 5       Patients Stated Pain Goal: 5 (06/19/19 0850)  Complications: Immediate Anesthesia Transfer of Care Note  Patient: Anthony Farley  Procedure(s) Performed: Procedure(s) (LRB): EXCISION OF PERIANAL CYST, COMPLEX CLOSURE OF 8 CM WOUND (N/A)  Patient Location: PACU  Anesthesia Type: General  Level of Consciousness:drowsy  Airway & Oxygen Therapy: Patient Spontanous Breathing and Patient connected to nasal cannula oxygen  Post-op Assessment: Report given to PACU RN and Post -op Vital signs reviewed and stable  Post vital signs: Reviewed and stable  Complications: No apparent anesthesia complications

## 2019-06-19 NOTE — Anesthesia Postprocedure Evaluation (Signed)
Anesthesia Post Note  Patient: Anthony Farley  Procedure(s) Performed: EXCISION OF PERIANAL CYST, COMPLEX CLOSURE OF 8 CM WOUND (N/A Perineum)     Patient location during evaluation: PACU Anesthesia Type: General Level of consciousness: awake and alert Pain management: pain level controlled Vital Signs Assessment: post-procedure vital signs reviewed and stable Respiratory status: spontaneous breathing, nonlabored ventilation, respiratory function stable and patient connected to nasal cannula oxygen Cardiovascular status: blood pressure returned to baseline and stable Postop Assessment: no apparent nausea or vomiting Anesthetic complications: no    Last Vitals:  Vitals:   06/19/19 1145 06/19/19 1229  BP: 125/87 120/81  Pulse: 80 71  Resp: 11 14  Temp:  36.7 C  SpO2: 99% 100%    Last Pain:  Vitals:   06/19/19 1229  TempSrc:   PainSc: 0-No pain                 Alaijah Gibler P Kelaiah Escalona

## 2019-06-19 NOTE — Discharge Instructions (Signed)
Post Anesthesia Home Care Instructions  Activity: Get plenty of rest for the remainder of the day. A responsible individual must stay with you for 24 hours following the procedure.  For the next 24 hours, DO NOT: -Drive a car -Paediatric nurse -Drink alcoholic beverages -Take any medication unless instructed by your physician -Make any legal decisions or sign important papers.  Meals: Start with liquid foods such as gelatin or soup. Progress to regular foods as tolerated. Avoid greasy, spicy, heavy foods. If nausea and/or vomiting occur, drink only clear liquids until the nausea and/or vomiting subsides. Call your physician if vomiting continues.  Special Instructions/Symptoms: Your throat may feel dry or sore from the anesthesia or the breathing tube placed in your throat during surgery. If this causes discomfort, gargle with warm salt water. The discomfort should disappear within 24 hours.   Post Anesthesia Home Care Instructions  Activity: Get plenty of rest for the remainder of the day. A responsible individual must stay with you for 24 hours following the procedure.  For the next 24 hours, DO NOT: -Drive a car -Paediatric nurse -Drink alcoholic beverages -Take any medication unless instructed by your physician -Make any legal decisions or sign important papers.  Meals: Start with liquid foods such as gelatin or soup. Progress to regular foods as tolerated. Avoid greasy, spicy, heavy foods. If nausea and/or vomiting occur, drink only clear liquids until the nausea and/or vomiting subsides. Call your physician if vomiting continues.  Special Instructions/Symptoms: Your throat may feel dry or sore from the anesthesia or the breathing tube placed in your throat during surgery. If this causes discomfort, gargle with warm salt water. The discomfort should disappear within 24 hours.   Epidermal Cyst Removal, Care After This sheet gives you information about how to care for  yourself after your procedure. Your health care provider may also give you more specific instructions. If you have problems or questions, contact your health care provider. What can I expect after the procedure? After the procedure, it is common to have:  Soreness in the area where your cyst was removed.  Tightness or itchiness from the stitches (sutures) in your skin. Follow these instructions at home: Medicines  Take over-the-counter and prescription medicines only as told by your health care provider.  If you were prescribed an antibiotic medicine or ointment, take or apply it as told by your health care provider. Do not stop using the antibiotic even if you start to feel better. Incision care   Follow instructions from your health care provider about how to take care of your incision. Make sure you: ? Wash your hands with soap and water before you change your bandage (dressing). If soap and water are not available, use hand sanitizer. ? Change your dressing as told by your health care provider. ? Leave sutures, skin glue, or adhesive strips in place. These skin closures may need to stay in place for 1-2 weeks or longer. If adhesive strip edges start to loosen and curl up, you may trim the loose edges. Do not remove adhesive strips completely unless your health care provider tells you to do that.  Keep the dressingdry until your health care provider says that it can be removed.  After your dressing is off, check your incision area every day for signs of infection. Check for: ? Redness, swelling, or pain. ? Fluid or blood. ? Warmth. ? Pus or a bad smell. General instructions  Do not take baths, swim, or use a hot tub  until your health care provider approves. Ask your health care provider if you may take showers. You may only be allowed to take sponge baths.  Your health care provider may ask you to avoid contact sports or activities that take a lot of effort. Do not do anything  that stretches or puts pressure on your incision.  You can return to your normal diet.  Keep all follow-up visits as told by your health care provider. This is important. Contact a health care provider if:  You have a fever.  You have redness, swelling, or pain in the incision area.  You have fluid or blood coming from your incision.  You have pus or a bad smell coming from your incision.  Your incision feels warm to the touch.  Your cyst grows back. Summary  After the procedure, it is common to have soreness in the area where your cyst was removed.  Take or apply over-the-counter and prescription medicines only as told by your health care provider.  Follow instructions from your health care provider about how to take care of your incision. This information is not intended to replace advice given to you by your health care provider. Make sure you discuss any questions you have with your health care provider. Document Revised: 06/13/2017 Document Reviewed: 12/15/2016 Elsevier Patient Education  2020 [employer].    Tylenol take after 5:00pm today as needed.

## 2019-06-19 NOTE — Anesthesia Procedure Notes (Signed)
Procedure Name: LMA Insertion Date/Time: 06/19/2019 10:24 AM Performed by: Norva Pavlov, CRNA Pre-anesthesia Checklist: Patient identified, Emergency Drugs available, Suction available and Patient being monitored Patient Re-evaluated:Patient Re-evaluated prior to induction Oxygen Delivery Method: Circle system utilized Preoxygenation: Pre-oxygenation with 100% oxygen Induction Type: IV induction Ventilation: Mask ventilation without difficulty LMA: LMA inserted LMA Size: 4.0 Number of attempts: 1 Airway Equipment and Method: Bite block Placement Confirmation: positive ETCO2 Tube secured with: Tape Dental Injury: Teeth and Oropharynx as per pre-operative assessment

## 2019-06-19 NOTE — Op Note (Addendum)
Preoperative diagnosis: left perineal cyst  Postoperative diagnosis: left perineal carbuncle  Procedure: excision of left perineal cyst, complex repair of 8 cm perineal wound  Surgeon: Feliciana Rossetti, M.D.  Asst: none  Anesthesia: general  Indications for procedure: Anthony Farley is a 44 y.o. year old male with symptoms of recurrent infections of the left posterior groin. After discussion options patient decided to proceed with excision the cyst and brought through day surgery.  Description of procedure: The patient was brought into the operative suite. Anesthesia was administered with General LMA anesthesia. WHO checklist was applied. The patient was then placed in lithotomy position. The area was prepped and draped in the usual sterile fashion.  Next, marcaine was infused into the area. There were several small cysts with a few openings that released white fluid on expression. This area was along the groin crease and posterior to the scrotum. An incision was made along the medial aspect of the larger cyst. Cautery was used to dissect down and around and it became apparent the cysts were all interconnected. The cysts was abutted to the fascia of the scrotum. The cystic mass was removed in its entirety. Cautery was used for hemostasis. The total removed cyst was 8 cm in length. The skin of the scrotum was apposed to the skin in of the medial thigh/crease with interrupted 3-0 vicryl in the deep dermal space and then a running subcuticular 4-0 monocryl. Care was taken to avoid undue tension and allow the skin to sit in a relaxed position. Xeroderm, gauze and jock were put in place for dressing. The patient awoke from anesthesia and brought to pacu in stable condition.  Findings: complex cyst of the left perineum.  Specimen: perineal cyst  Implant: none   Blood loss: 15 ml  Local anesthesia: 30 ml marcaine   Complications: none  Feliciana Rossetti, M.D. General, Bariatric, & Minimally Invasive  Surgery [facility]

## 2019-06-20 LAB — SURGICAL PATHOLOGY

## 2019-06-22 ENCOUNTER — Other Ambulatory Visit: Payer: Self-pay

## 2019-06-22 ENCOUNTER — Encounter (HOSPITAL_COMMUNITY): Payer: Self-pay | Admitting: Emergency Medicine

## 2019-06-22 ENCOUNTER — Emergency Department (HOSPITAL_COMMUNITY)
Admission: EM | Admit: 2019-06-22 | Discharge: 2019-06-22 | Disposition: A | Discharge status: Home / Self Care | Payer: 59 | Attending: Emergency Medicine | Admitting: Emergency Medicine

## 2019-06-22 DIAGNOSIS — L0501 Pilonidal cyst with abscess: Secondary | ICD-10-CM

## 2019-06-22 DIAGNOSIS — L0231 Cutaneous abscess of buttock: Secondary | ICD-10-CM | POA: Diagnosis present

## 2019-06-22 DIAGNOSIS — F17210 Nicotine dependence, cigarettes, uncomplicated: Secondary | ICD-10-CM | POA: Insufficient documentation

## 2019-06-22 MED ORDER — LIDOCAINE-EPINEPHRINE 2 %-1:100000 IJ SOLN
20.0000 mL | Freq: Once | INTRAMUSCULAR | Status: DC
  Filled 2019-06-22: qty 1

## 2019-06-22 NOTE — ED Provider Notes (Signed)
[facility] Provider Note   CSN: [id number] Arrival date & time: 06/22/19  1943     History Chief Complaint  Patient presents with  . Abscess    Anthony Farley is a 44 y.o. male who presents to the ED with cc of gluteal cleft abscess. He presnts with recurrent abscess of his gluteal cleft. He has a history of multiple abscesses to the groin and gluteal cleft. Patient had a abscess drained by Dr. Chesley Mires on Wednesday April 14th. He had worsening pain of his gluteus and realize he was developing a recurrent abscess. He denies fevers chills or difficulty with defecation.  HPI     Past Medical History:  Diagnosis Date  . BPH (benign prostatic hyperplasia)   . BPH (benign prostatic hyperplasia)   . Family history of sudden cardiac death in father   . GERD (gastroesophageal reflux disease)   . Hiatal hernia   . History of kidney stones   . Sebaceous cyst    perineum    Patient Active Problem List   Diagnosis Date Noted  . Pilonidal cyst with abscess 02/07/2012    Past Surgical History:  Procedure Laterality Date  . COLONOSCOPY WITH ESOPHAGOGASTRODUODENOSCOPY (EGD)  06/13/2016  . EXCISION OF KELOID N/A 06/19/2019   Procedure: EXCISION OF PERIANAL CYST, COMPLEX CLOSURE OF 8 CM WOUND;  Surgeon: Kinsinger, De Blanch, MD;  Location: [facility];  Service: General;  Laterality: N/A;  . MASS EXCISION  11/18/2011   Procedure: MINOR EXCISION OF MASS;  Surgeon: Ernestene Mention, MD;  Location: [facility];  Service: General;  Laterality: N/A;  excise scrotal mass  . PILONIDAL CYST EXCISION  x3  last one on 04-30-2004  @[facility]        Family History  Problem Relation Age of Onset  . Heart disease Father   . Diabetes Paternal Grandmother     Social History   Tobacco Use  . Smoking status: Current Every Day Smoker    Packs/day: 1.00    Years: 27.00    Pack years: 27.00    Types: Cigarettes  . Smokeless tobacco:  Never Used  Substance Use Topics  . Alcohol use: No  . Drug use: Never    Home Medications Prior to Admission medications   Medication Sig Start Date End Date Taking? Authorizing Provider  amoxicillin-clavulanate (AUGMENTIN) 875-125 MG tablet Take 1 tablet by mouth every 12 (twelve) hours. 06/19/19   Kinsinger, 06/21/19, MD  famotidine (PEPCID) 20 MG tablet Take 20 mg by mouth as needed for heartburn or indigestion.    [provider]  ibuprofen (ADVIL) 800 MG tablet Take 1 tablet (800 mg total) by mouth every 8 (eight) hours as needed. 06/19/19   Kinsinger, 06/21/19, MD  oxyCODONE (OXY IR/ROXICODONE) 5 MG immediate release tablet Take 1 tablet (5 mg total) by mouth every 6 (six) hours as needed for severe pain. 06/19/19   Kinsinger, 06/21/19, MD  RABEprazole (ACIPHEX) 20 MG tablet Take 20 mg by mouth at bedtime.    [provider]  tamsulosin (FLOMAX) 0.4 MG CAPS capsule Take 0.4 mg by mouth at bedtime.    [provider]    Allergies    Patient has no known allergies.  Review of Systems   Review of Systems Ten systems reviewed and are negative for acute change, except as noted in the HPI.   Physical Exam Updated Vital Signs BP (!) 151/89 (BP Location: Left Arm)  Pulse 92   Temp 98.2 F (36.8 C) (Oral)   Resp 18   Ht 5\' 8"  (1.727 m)   Wt 93.4 kg   SpO2 100%   BMI 31.32 kg/m   Physical Exam Vitals and nursing note reviewed.  Constitutional:      General: He is not in acute distress.    Appearance: He is well-developed. He is not diaphoretic.  HENT:     Head: Normocephalic and atraumatic.  Eyes:     General: No scleral icterus.    Conjunctiva/sclera: Conjunctivae normal.  Cardiovascular:     Rate and Rhythm: Normal rate and regular rhythm.     Heart sounds: Normal heart sounds.  Pulmonary:     Effort: Pulmonary effort is normal. No respiratory distress.     Breath sounds: Normal breath sounds.  Abdominal:     Palpations: Abdomen is  soft.     Tenderness: There is no abdominal tenderness.  Genitourinary:    Comments: Large area of fluctuance at the left side of the gluteal cleft. There is tenderness all along the gluteal cleft bilaterally Musculoskeletal:     Cervical back: Normal range of motion and neck supple.  Skin:    General: Skin is warm and dry.  Neurological:     Mental Status: He is alert.  Psychiatric:        Behavior: Behavior normal.     ED Results / Procedures / Treatments   Labs (all labs ordered are listed, but only abnormal results are displayed) Labs Reviewed - No data to display  EKG None  Radiology No results found.  Procedures .Marland KitchenIncision and Drainage  Date/Time: 06/22/2019 10:26 PM Performed by: Margarita Mail, PA-C Authorized by: Margarita Mail, PA-C   Consent:    Consent obtained:  Verbal   Consent given by:  Patient   Risks discussed:  Bleeding, incomplete drainage, pain and damage to other organs   Alternatives discussed:  No treatment Universal protocol:    Procedure explained and questions answered to patient or proxy's satisfaction: yes     Relevant documents present and verified: yes     Test results available and properly labeled: yes     Imaging studies available: yes     Required blood products, implants, devices, and special equipment available: yes     Site/side marked: yes     Immediately prior to procedure a time out was called: yes     Patient identity confirmed:  Verbally with patient Location:    Type:  Abscess   Size:  4   Location:  Anogenital   Anogenital location:  Gluteal cleft Pre-procedure details:    Skin preparation:  Betadine Anesthesia (see MAR for exact dosages):    Anesthesia method:  Local infiltration   Local anesthetic:  Lidocaine 2% WITH epi Procedure type:    Complexity:  Complex Procedure details:    Incision types:  Cruciate   Incision depth:  Subcutaneous   Scalpel blade:  11   Wound management:  Probed and deloculated,  irrigated with saline and extensive cleaning   Drainage:  Purulent   Drainage amount:  Moderate   Packing materials:  1/4 in gauze   Amount 1/4":  2 in Post-procedure details:    Patient tolerance of procedure:  Tolerated well, no immediate complications   (including critical care time)  Medications Ordered in ED Medications  lidocaine-EPINEPHrine (XYLOCAINE W/EPI) 2 %-1:100000 (with pres) injection 20 mL (0 mLs Intradermal Hold 06/22/19 2045)    ED Course  I have reviewed the triage vital signs and the nursing notes.  Pertinent labs & imaging results that were available during my care of the patient were reviewed by me and considered in my medical decision making (see chart for details).    MDM Rules/Calculators/A&P                      Anthony Farley presents with abscess. There is no area of retained pus after procedure. The presentation of Anthony Farley is NOT consistent with necrotizing fascitis or osteomyolitis. There is no evidence of retained foreign body, neurovascular or tendon injury. The presentation of Anthony Farley is NOT consistent with sepsis and/or bacteremia. Anthony Farley meets outpatient criteria for treatment  and is sent home without abx.   Strict return and follow-up precautions have been given by me personally or by detailed written instructions verbalized by nursing staff using the teach back method to the patient/family/caregiver(s).  Data Reviewed/Counseling: I have reviewed the patient's vital signs, nursing notes, and other relevant tests/information. I had a detailed discussion regarding the historical points, exam findings, and any diagnostic results supporting the discharge diagnosis. I also discussed the need for outpatient follow-up and the need to return to the ED if symptoms worsen or if there are any questions or concerns that arise at home.  Final Clinical Impression(s) / ED Diagnoses Final diagnoses:  Pilonidal abscess    Rx / DC Orders ED  Discharge Orders    None       Arthor Captain, PA-C 06/22/19 2227    Linwood Dibbles, MD 06/23/19 [phone]

## 2019-06-22 NOTE — ED Notes (Signed)
Pt lying in bed on left side. Pt denies any needs. NAD noted. I&D tray with lido placed at bedside for MD. Will continue to monitor.

## 2019-06-22 NOTE — ED Triage Notes (Signed)
Patient has an abscess on the sacrum that started Wednesday.

## 2019-06-22 NOTE — Discharge Instructions (Addendum)
Please remove your packing in 2 days. Return for any new or worsening symptoms.

## 2019-07-27 ENCOUNTER — Other Ambulatory Visit (HOSPITAL_COMMUNITY): Payer: 59

## 2020-02-07 ENCOUNTER — Ambulatory Visit (INDEPENDENT_AMBULATORY_CARE_PROVIDER_SITE_OTHER): Payer: 59

## 2020-02-07 ENCOUNTER — Other Ambulatory Visit: Payer: Self-pay

## 2020-02-07 ENCOUNTER — Ambulatory Visit: Payer: 59 | Admitting: Podiatry

## 2020-02-07 ENCOUNTER — Encounter: Payer: Self-pay | Admitting: Podiatry

## 2020-02-07 VITALS — BP 114/77 | HR 86 | Temp 99.6°F | Resp 16

## 2020-02-07 DIAGNOSIS — M722 Plantar fascial fibromatosis: Secondary | ICD-10-CM | POA: Diagnosis not present

## 2020-02-07 DIAGNOSIS — B070 Plantar wart: Secondary | ICD-10-CM

## 2020-02-07 MED ORDER — MELOXICAM 15 MG PO TABS: 15.0000 mg | ORAL_TABLET | Freq: Every day | ORAL | 1 refills | Status: DC

## 2020-02-07 NOTE — Progress Notes (Signed)
   Subjective: 44 y.o. male presenting as a new patient for evaluation of right heel pain.  Patient recently was given an IM injection of prednisone as well as oral anti-inflammatory prednisone secondary to a rash that developed approximately 1 week ago.  He states that since taking the prednisone his heel is improved significantly.  He denies history of trauma or injury to the area. Patient also presents for symptomatic callus skin lesion to the plantar aspect of the left midfoot.  He states has been very painful for the last month.  He has never had anything like this before.  He has not not done anything for treatment he presents for further treatment evaluation   Past Medical History:  Diagnosis Date  . BPH (benign prostatic hyperplasia)   . BPH (benign prostatic hyperplasia)   . Family history of sudden cardiac death in father   . GERD (gastroesophageal reflux disease)   . Hiatal hernia   . History of kidney stones   . Sebaceous cyst    perineum     Objective: Physical Exam General: The patient is alert and oriented x3 in no acute distress.  Dermatology: Skin is warm, dry and supple bilateral lower extremities. Negative for open lesions or macerations bilateral.  Hyperkeratotic skin lesion noted with pinpoint bleeding to the left plantar foot consistent with a plantar verruca  Vascular: Dorsalis Pedis and Posterior Tibial pulses palpable bilateral.  Capillary fill time is immediate to all digits.  Neurological: Epicritic and protective threshold intact bilateral.   Musculoskeletal: Tenderness to palpation to the plantar aspect of the right heel along the plantar fascia. All other joints range of motion within normal limits bilateral. Strength 5/5 in all groups bilateral.   Radiographic exam: Normal osseous mineralization. Joint spaces preserved. No fracture/dislocation/boney destruction. No other soft tissue abnormalities or radiopaque foreign bodies.   Assessment: 1. Plantar  fasciitis right 2.  Plantar verruca left foot  Plan of Care:  1. Patient evaluated. Xrays reviewed.   2.  Finish oral prednisone as prescribed by previous physician for the skin rash 3. Rx for Meloxicam ordered for patient to begin taking after completion of the Dosepak  4. Instructed patient regarding therapies and modalities at home to alleviate symptoms.  5.  Excisional debridement of the verruca was performed using a surgical 312 scalpel without incident or bleeding.  Salicylic acid applied. 6.  Recommend OTC salicylic acid daily 7.  Return to clinic as needed   Felecia Shelling, DPM [facility]  Dr. Felecia Shelling, DPM    [address] [place], [place] [address]                Office [phone]  Fax [phone]

## 2020-03-24 ENCOUNTER — Encounter (HOSPITAL_COMMUNITY): Payer: Self-pay | Admitting: Emergency Medicine

## 2020-03-24 ENCOUNTER — Other Ambulatory Visit: Payer: Self-pay

## 2020-03-24 ENCOUNTER — Emergency Department (HOSPITAL_COMMUNITY)
Admission: EM | Admit: 2020-03-24 | Discharge: 2020-03-24 | Disposition: A | Discharge status: Home / Self Care | Payer: 59 | Attending: Emergency Medicine | Admitting: Emergency Medicine

## 2020-03-24 DIAGNOSIS — F17210 Nicotine dependence, cigarettes, uncomplicated: Secondary | ICD-10-CM | POA: Insufficient documentation

## 2020-03-24 DIAGNOSIS — K6289 Other specified diseases of anus and rectum: Secondary | ICD-10-CM | POA: Diagnosis present

## 2020-03-24 DIAGNOSIS — K645 Perianal venous thrombosis: Secondary | ICD-10-CM

## 2020-03-24 MED ORDER — LIDOCAINE 5 % EX OINT: 1.0000 "application " | TOPICAL_OINTMENT | Freq: Four times a day (QID) | CUTANEOUS | 0 refills | Status: AC | PRN

## 2020-03-24 MED ORDER — DOCUSATE SODIUM 100 MG PO CAPS: 100.0000 mg | ORAL_CAPSULE | Freq: Two times a day (BID) | ORAL | 0 refills | Status: DC

## 2020-03-24 NOTE — Discharge Instructions (Signed)
Contact a health care provider if you have: Increasing pain and swelling that are not controlled by treatment or medicine. Difficulty having a bowel movement, or you are unable to have a bowel movement. Pain or inflammation outside the area of the hemorrhoids. Get help right away if you have: Uncontrolled bleeding from your rectum. 

## 2020-03-24 NOTE — ED Triage Notes (Signed)
Pt here with c/o bleeding hemorrhoids since sat, pt states minimal pain

## 2020-03-24 NOTE — ED Provider Notes (Signed)
[facility] EMERGENCY DEPARTMENT Provider Note   CSN: [id number] Arrival date & time: 03/24/20  0865     History No chief complaint on file.   Anthony Farley is a 45 y.o. male who presents for evaluation of burning rectal pain.  Patient states that 3 days ago he noticed a sensation of something bulging around his rectum.  He felt back to his bottom and noticed that there were multiple areas it felt swollen.  He does not have any pain but complains of itching and burning in his rectum.  He has been "afraid to take a bowel movement" since this began.  He denies any abdominal pain, rectal pain, bleeding.  HPI     Past Medical History:  Diagnosis Date  . BPH (benign prostatic hyperplasia)   . BPH (benign prostatic hyperplasia)   . Family history of sudden cardiac death in father   . GERD (gastroesophageal reflux disease)   . Hiatal hernia   . History of kidney stones   . Sebaceous cyst    perineum    Patient Active Problem List   Diagnosis Date Noted  . Benign prostatic hyperplasia without lower urinary tract symptoms 09/20/2017  . Obesity (BMI 30.0-34.9) 09/20/2017  . Cigarette nicotine dependence without complication 04/18/2016  . Family history of heart disease in male family member before age 39 04/18/2016  . Gastroesophageal reflux disease with esophagitis without hemorrhage 04/18/2016  . Pilonidal cyst with abscess 02/07/2012    Past Surgical History:  Procedure Laterality Date  . COLONOSCOPY WITH ESOPHAGOGASTRODUODENOSCOPY (EGD)  06/13/2016  . EXCISION OF KELOID N/A 06/19/2019   Procedure: EXCISION OF PERIANAL CYST, COMPLEX CLOSURE OF 8 CM WOUND;  Surgeon: Kinsinger, De Blanch, MD;  Location: [facility];  Service: General;  Laterality: N/A;  . MASS EXCISION  11/18/2011   Procedure: MINOR EXCISION OF MASS;  Surgeon: Ernestene Mention, MD;  Location: [facility];  Service: General;  Laterality: N/A;  excise scrotal mass  .  PILONIDAL CYST EXCISION  x3  last one on 04-30-2004  @[facility]        Family History  Problem Relation Age of Onset  . Heart disease Father   . Diabetes Paternal Grandmother     Social History   Tobacco Use  . Smoking status: Current Every Day Smoker    Packs/day: 1.00    Years: 27.00    Pack years: 27.00    Types: Cigarettes  . Smokeless tobacco: Never Used  Vaping Use  . Vaping Use: Never used  Substance Use Topics  . Alcohol use: No  . Drug use: Never    Home Medications Prior to Admission medications   Medication Sig Start Date End Date Taking? Authorizing Provider  amoxicillin-clavulanate (AUGMENTIN) 875-125 MG tablet Take 1 tablet by mouth every 12 (twelve) hours. 06/19/19   Kinsinger, 06/21/19, MD  famotidine (PEPCID) 20 MG tablet Take 20 mg by mouth as needed for heartburn or indigestion.    [provider]  ibuprofen (ADVIL) 800 MG tablet Take 1 tablet (800 mg total) by mouth every 8 (eight) hours as needed. 06/19/19   Kinsinger, 06/21/19, MD  meloxicam (MOBIC) 15 MG tablet Take 1 tablet (15 mg total) by mouth daily. 02/07/20   14/3/21, DPM  oxyCODONE (OXY IR/ROXICODONE) 5 MG immediate release tablet Take 1 tablet (5 mg total) by mouth every 6 (six) hours as needed for severe pain. 06/19/19   Kinsinger, 06/21/19, MD  RABEprazole (  ACIPHEX) 20 MG tablet Take 20 mg by mouth at bedtime.    [provider]  tamsulosin (FLOMAX) 0.4 MG CAPS capsule Take 0.4 mg by mouth at bedtime.    [provider]  varenicline (CHANTIX) 1 MG tablet Take by mouth. 10/31/19   [provider]    Allergies    Patient has no known allergies.  Review of Systems   Review of Systems Ten systems reviewed and are negative for acute change, except as noted in the HPI.   Physical Exam Updated Vital Signs BP (!) 134/91   Pulse (!) 101   Temp 99.5 F (37.5 C) (Oral)   Resp 16   Ht 5\' 8"  (1.727 m)   Wt 95.3 kg   SpO2 100%   BMI 31.93 kg/m    Physical Exam Vitals and nursing note reviewed.  Constitutional:      General: He is not in acute distress.    Appearance: He is well-developed and well-nourished. He is not diaphoretic.  HENT:     Head: Normocephalic and atraumatic.  Eyes:     General: No scleral icterus.    Conjunctiva/sclera: Conjunctivae normal.  Cardiovascular:     Rate and Rhythm: Normal rate and regular rhythm.     Heart sounds: Normal heart sounds.  Pulmonary:     Effort: Pulmonary effort is normal. No respiratory distress.     Breath sounds: Normal breath sounds.  Abdominal:     Palpations: Abdomen is soft.     Tenderness: There is no abdominal tenderness.  Genitourinary:    Comments: Multiple small thrombosed hemorrhoids around the rectum.  Normal rectal tone without bleeding, no significant tenderness.  No obvious bleeding or prolapse.  No fissures. Musculoskeletal:        General: No edema.     Cervical back: Normal range of motion and neck supple.  Skin:    General: Skin is warm and dry.  Neurological:     Mental Status: He is alert.  Psychiatric:        Behavior: Behavior normal.     ED Results / Procedures / Treatments   Labs (all labs ordered are listed, but only abnormal results are displayed) Labs Reviewed - No data to display  EKG None  Radiology No results found.  Procedures Procedures (including critical care time)  Medications Ordered in ED Medications - No data to display  ED Course  I have reviewed the triage vital signs and the nursing notes.  Pertinent labs & imaging results that were available during my care of the patient were reviewed by me and considered in my medical decision making (see chart for details).    MDM Rules/Calculators/A&P                         patient here with 2 small thrombosed external hemorrhoids.  No evidence of infection.  Thrombosis is soft and not significantly tense.  Mostly he has a lot of burning in this area.  Will discharge with  lidocaine ointment.  Home care and surgical referral.  Discussed return precautions.  He presented was appropriate for discharge at this time  Final Clinical Impression(s) / ED Diagnoses Final diagnoses:  None    Rx / DC Orders ED Discharge Orders    None       , PA-C 03/24/20 1021    03/26/20, MD 03/24/20 1702

## 2020-03-25 ENCOUNTER — Other Ambulatory Visit (HOSPITAL_COMMUNITY): Payer: Self-pay | Admitting: Student

## 2020-03-25 DIAGNOSIS — M7918 Myalgia, other site: Secondary | ICD-10-CM

## 2020-03-26 ENCOUNTER — Ambulatory Visit
Admission: RE | Admit: 2020-03-26 | Discharge: 2020-03-26 | Disposition: A | Discharge status: Home / Self Care | Payer: 59 | Source: Ambulatory Visit | Attending: Student | Admitting: Student

## 2020-03-26 ENCOUNTER — Other Ambulatory Visit: Payer: Self-pay

## 2020-03-26 DIAGNOSIS — M7918 Myalgia, other site: Secondary | ICD-10-CM | POA: Insufficient documentation

## 2020-03-26 MED ORDER — IOHEXOL 300 MG/ML  SOLN
100.0000 mL | Freq: Once | INTRAMUSCULAR | Status: AC | PRN
  Administered 2020-03-26: 100 mL via INTRAVENOUS

## 2020-03-29 ENCOUNTER — Encounter (HOSPITAL_BASED_OUTPATIENT_CLINIC_OR_DEPARTMENT_OTHER): Payer: Self-pay

## 2020-03-29 ENCOUNTER — Inpatient Hospital Stay (HOSPITAL_BASED_OUTPATIENT_CLINIC_OR_DEPARTMENT_OTHER)
Admission: EM | Admit: 2020-03-29 | Discharge: 2020-03-31 | DRG: 603 | Disposition: A | Discharge status: Home / Self Care | Payer: 59 | Attending: Family Medicine | Admitting: Family Medicine

## 2020-03-29 ENCOUNTER — Emergency Department (HOSPITAL_BASED_OUTPATIENT_CLINIC_OR_DEPARTMENT_OTHER): Payer: 59

## 2020-03-29 ENCOUNTER — Other Ambulatory Visit: Payer: Self-pay

## 2020-03-29 DIAGNOSIS — K219 Gastro-esophageal reflux disease without esophagitis: Secondary | ICD-10-CM | POA: Diagnosis present

## 2020-03-29 DIAGNOSIS — F17210 Nicotine dependence, cigarettes, uncomplicated: Secondary | ICD-10-CM | POA: Diagnosis present

## 2020-03-29 DIAGNOSIS — Z0184 Encounter for antibody response examination: Secondary | ICD-10-CM

## 2020-03-29 DIAGNOSIS — Z8241 Family history of sudden cardiac death: Secondary | ICD-10-CM

## 2020-03-29 DIAGNOSIS — N5089 Other specified disorders of the male genital organs: Secondary | ICD-10-CM | POA: Diagnosis present

## 2020-03-29 DIAGNOSIS — N400 Enlarged prostate without lower urinary tract symptoms: Secondary | ICD-10-CM | POA: Diagnosis present

## 2020-03-29 DIAGNOSIS — L0591 Pilonidal cyst without abscess: Principal | ICD-10-CM | POA: Diagnosis present

## 2020-03-29 DIAGNOSIS — Z79899 Other long term (current) drug therapy: Secondary | ICD-10-CM

## 2020-03-29 DIAGNOSIS — K449 Diaphragmatic hernia without obstruction or gangrene: Secondary | ICD-10-CM | POA: Diagnosis present

## 2020-03-29 DIAGNOSIS — Z8249 Family history of ischemic heart disease and other diseases of the circulatory system: Secondary | ICD-10-CM

## 2020-03-29 DIAGNOSIS — Z20822 Contact with and (suspected) exposure to covid-19: Secondary | ICD-10-CM | POA: Diagnosis present

## 2020-03-29 DIAGNOSIS — L03317 Cellulitis of buttock: Secondary | ICD-10-CM | POA: Diagnosis present

## 2020-03-29 DIAGNOSIS — K5900 Constipation, unspecified: Secondary | ICD-10-CM | POA: Diagnosis present

## 2020-03-29 DIAGNOSIS — L03315 Cellulitis of perineum: Principal | ICD-10-CM | POA: Diagnosis present

## 2020-03-29 DIAGNOSIS — E669 Obesity, unspecified: Secondary | ICD-10-CM | POA: Diagnosis present

## 2020-03-29 DIAGNOSIS — K2100 Gastro-esophageal reflux disease with esophagitis, without bleeding: Secondary | ICD-10-CM | POA: Diagnosis present

## 2020-03-29 LAB — COMPREHENSIVE METABOLIC PANEL
ALT: 14 U/L (ref 0–44)
AST: 20 U/L (ref 15–41)
Albumin: 4.2 g/dL (ref 3.5–5.0)
Alkaline Phosphatase: 76 U/L (ref 38–126)
Anion gap: 11 (ref 5–15)
BUN: 18 mg/dL (ref 6–20)
CO2: 25 mmol/L (ref 22–32)
Calcium: 9.6 mg/dL (ref 8.9–10.3)
Chloride: 105 mmol/L (ref 98–111)
Creatinine, Ser: 1.29 mg/dL — ABNORMAL HIGH (ref 0.61–1.24)
GFR, Estimated: >60 mL/min (ref >60)
Glucose, Bld: 85 mg/dL (ref 70–99)
Potassium: 4.2 mmol/L (ref 3.5–5.1)
Sodium: 141 mmol/L (ref 135–145)
Total Bilirubin: 0.5 mg/dL (ref 0.3–1.2)
Total Protein: 7.3 g/dL (ref 6.5–8.1)

## 2020-03-29 LAB — CBC WITH DIFFERENTIAL/PLATELET
Abs Immature Granulocytes: 0.11 10*3/uL — ABNORMAL HIGH (ref 0.00–0.07)
Basophils Absolute: 0.1 10*3/uL (ref 0.0–0.1)
Basophils Relative: 0 %
Eosinophils Absolute: 0.7 10*3/uL — ABNORMAL HIGH (ref 0.0–0.5)
Eosinophils Relative: 5 %
HCT: 52.1 % — ABNORMAL HIGH (ref 39.0–52.0)
Hemoglobin: 16.9 g/dL (ref 13.0–17.0)
Immature Granulocytes: 1 %
Lymphocytes Relative: 17 %
Lymphs Abs: 2.3 10*3/uL (ref 0.7–4.0)
MCH: 28.7 pg (ref 26.0–34.0)
MCHC: 32.4 g/dL (ref 30.0–36.0)
MCV: 88.6 fL (ref 80.0–100.0)
Monocytes Absolute: 0.9 10*3/uL (ref 0.1–1.0)
Monocytes Relative: 6 %
Neutro Abs: 9.8 10*3/uL — ABNORMAL HIGH (ref 1.7–7.7)
Neutrophils Relative %: 71 %
Platelets: 217 10*3/uL (ref 150–400)
RBC: 5.88 MIL/uL — ABNORMAL HIGH (ref 4.22–5.81)
RDW: 13.2 % (ref 11.5–15.5)
WBC: 13.8 10*3/uL — ABNORMAL HIGH (ref 4.0–10.5)
nRBC: 0 % (ref 0.0–0.2)

## 2020-03-29 LAB — LACTIC ACID, PLASMA: Lactic Acid, Venous: 1.5 mmol/L (ref 0.5–1.9)

## 2020-03-29 MED ORDER — DIPHENHYDRAMINE HCL 25 MG PO CAPS
25.0000 mg | ORAL_CAPSULE | Freq: Once | ORAL | Status: AC
  Administered 2020-03-29: 25 mg via ORAL
  Filled 2020-03-29: qty 1

## 2020-03-29 MED ORDER — IOHEXOL 300 MG/ML  SOLN
100.0000 mL | Freq: Once | INTRAMUSCULAR | Status: AC | PRN
  Administered 2020-03-29: 100 mL via INTRAVENOUS

## 2020-03-29 MED ORDER — VANCOMYCIN HCL IN DEXTROSE 1-5 GM/200ML-% IV SOLN
1000.0000 mg | Freq: Once | INTRAVENOUS | Status: AC
  Administered 2020-03-30: 1000 mg via INTRAVENOUS
  Filled 2020-03-29: qty 200

## 2020-03-29 MED ORDER — HYDROCODONE-ACETAMINOPHEN 5-325 MG PO TABS
1.0000 | ORAL_TABLET | Freq: Once | ORAL | Status: AC
  Administered 2020-03-29: 1 via ORAL
  Filled 2020-03-29: qty 1

## 2020-03-29 NOTE — ED Triage Notes (Signed)
Pt had hemorrhoid on 1/14, was seen [facility], followed up with [place] surgery who stated cant treat him. Also states he thinks he has a pilonidal cyst that "busted" during that process. States now having raw areas to groin, testicles and buttocks. States everything "down there" is swollen and irritated. Wearing a diaper d/t amount of discharge.

## 2020-03-29 NOTE — ED Notes (Signed)
RN to room to chaperone exam.

## 2020-03-29 NOTE — ED Provider Notes (Signed)
[facility] EMERGENCY DEPARTMENT Provider Note   CSN: [id number] Arrival date & time: 03/29/20  1657     History Chief Complaint  Patient presents with  . Groin Pain    Anthony Farley is a 45 y.o. male.  HPI     This a 45 year old male with a history of sebaceous cyst of the perineum, perirectal abscess who presents with multiple lesions in the perineum, buttock area, and groin.  Patient reports that he initially thought he had a hemorrhoid.  He was seen and evaluated in the emergency department on 03/24/2020.  He was referred to general surgery.  He initially was seen and referred for CT scan of the pelvis to evaluate some skin findings.  He states that he was referred to [facility] because "they could not do anything for me."  He is unsure of what was seen on the CT scan.  He states he was not given any antibiotics.  Since that time he has noted multiple draining lesions of the buttock, groin, scrotal area.  He does not have any fever.  He does report significant pain and discomfort.  He denies any history of HIV or concerns for STDs.  He reports having sex primarily with women.  I reviewed his chart.  CT scan of the pelvis on 1/20 revealed thickening of the bilateral buttock but no obvious abscess.  He had a perianal abscess that was taken care of by Dr. Sheliah Hatch in 2021.    Past Medical History:  Diagnosis Date  . BPH (benign prostatic hyperplasia)   . BPH (benign prostatic hyperplasia)   . Family history of sudden cardiac death in father   . GERD (gastroesophageal reflux disease)   . Hiatal hernia   . History of kidney stones   . Sebaceous cyst    perineum    Patient Active Problem List   Diagnosis Date Noted  . Cellulitis of buttock 03/30/2020  . Benign prostatic hyperplasia without lower urinary tract symptoms 09/20/2017  . Obesity (BMI 30.0-34.9) 09/20/2017  . Cigarette nicotine dependence without complication 04/18/2016  . Family history of heart disease in male  family member before age 60 04/18/2016  . Gastroesophageal reflux disease with esophagitis without hemorrhage 04/18/2016  . Pilonidal cyst with abscess 02/07/2012    Past Surgical History:  Procedure Laterality Date  . COLONOSCOPY WITH ESOPHAGOGASTRODUODENOSCOPY (EGD)  06/13/2016  . EXCISION OF KELOID N/A 06/19/2019   Procedure: EXCISION OF PERIANAL CYST, COMPLEX CLOSURE OF 8 CM WOUND;  Surgeon: Kinsinger, De Blanch, MD;  Location: [facility];  Service: General;  Laterality: N/A;  . MASS EXCISION  11/18/2011   Procedure: MINOR EXCISION OF MASS;  Surgeon: Ernestene Mention, MD;  Location: [facility];  Service: General;  Laterality: N/A;  excise scrotal mass  . PILONIDAL CYST EXCISION  x3  last one on 04-30-2004  @[facility]        Family History  Problem Relation Age of Onset  . Heart disease Father   . Diabetes Paternal Grandmother     Social History   Tobacco Use  . Smoking status: Current Every Day Smoker    Packs/day: 1.00    Years: 27.00    Pack years: 27.00    Types: Cigarettes  . Smokeless tobacco: Never Used  Vaping Use  . Vaping Use: Never used  Substance Use Topics  . Alcohol use: No  . Drug use: Never    Home Medications Prior to Admission medications   Medication Sig  Start Date End Date Taking? Authorizing Provider  amoxicillin-clavulanate (AUGMENTIN) 875-125 MG tablet Take 1 tablet by mouth every 12 (twelve) hours. 06/19/19   Kinsinger, De Blanch, MD  docusate sodium (COLACE) 100 MG capsule Take 1 capsule (100 mg total) by mouth every 12 (twelve) hours. 03/24/20   Arthor Captain, PA-C  famotidine (PEPCID) 20 MG tablet Take 20 mg by mouth as needed for heartburn or indigestion.    [provider]  ibuprofen (ADVIL) 800 MG tablet Take 1 tablet (800 mg total) by mouth every 8 (eight) hours as needed. 06/19/19   Kinsinger, De Blanch, MD  lidocaine (XYLOCAINE) 5 % ointment Apply 1 application topically 4 (four) times daily as needed.  03/24/20   Arthor Captain, PA-C  meloxicam (MOBIC) 15 MG tablet Take 1 tablet (15 mg total) by mouth daily. 02/07/20   Felecia Shelling, DPM  oxyCODONE (OXY IR/ROXICODONE) 5 MG immediate release tablet Take 1 tablet (5 mg total) by mouth every 6 (six) hours as needed for severe pain. 06/19/19   Kinsinger, De Blanch, MD  RABEprazole (ACIPHEX) 20 MG tablet Take 20 mg by mouth at bedtime.    [provider]  tamsulosin (FLOMAX) 0.4 MG CAPS capsule Take 0.4 mg by mouth at bedtime.    [provider]  varenicline (CHANTIX) 1 MG tablet Take by mouth. 10/31/19   [provider]    Allergies    Patient has no known allergies.  Review of Systems   Review of Systems  Constitutional: Negative for fever.  Respiratory: Negative for shortness of breath.   Cardiovascular: Negative for chest pain.  Gastrointestinal: Negative for abdominal pain and nausea.  Genitourinary: Positive for genital sores, scrotal swelling and testicular pain. Negative for dysuria and penile pain.  Skin: Positive for color change and wound.       Skin breakdown  All other systems reviewed and are negative.   Physical Exam Updated Vital Signs BP 118/77 (BP Location: Right Arm)   Pulse 73   Temp 98.5 F (36.9 C) (Oral)   Resp 14   SpO2 97%   Physical Exam Vitals and nursing note reviewed.  Constitutional:      Appearance: He is well-developed and well-nourished. He is not ill-appearing.  HENT:     Head: Normocephalic and atraumatic.     Nose: Nose normal.     Mouth/Throat:     Mouth: Mucous membranes are moist.  Eyes:     Pupils: Pupils are equal, round, and reactive to light.  Cardiovascular:     Rate and Rhythm: Regular rhythm. Tachycardia present.     Heart sounds: Normal heart sounds. No murmur heard.   Pulmonary:     Effort: Pulmonary effort is normal. No respiratory distress.     Breath sounds: Normal breath sounds. No wheezing.  Abdominal:     General: Bowel sounds are normal.      Palpations: Abdomen is soft.     Tenderness: There is no abdominal tenderness. There is no rebound.  Genitourinary:    Comments: Diffuse swelling of the scrotum, no crepitus noted, uncircumcised two large hemorrhoids noted externally Musculoskeletal:        General: No edema.     Cervical back: Neck supple.     Right lower leg: No edema.     Left lower leg: No edema.  Lymphadenopathy:     Cervical: No cervical adenopathy.  Skin:    General: Skin is warm and dry.     Comments: Oozing lesion  at the superior aspect of the gluteal cleft, there is lichenification and thickening of the skin of the bilateral buttocks with skin breakdown and ulcerations noted throughout, this also affects the skin of the perineum and posterior aspect of the scrotum, no crepitus noted, no significant erythema of the scrotum noted, no fluctuance  Neurological:     Mental Status: He is alert and oriented to person, place, and time.  Psychiatric:        Mood and Affect: Mood and affect normal.     ED Results / Procedures / Treatments   Labs (all labs ordered are listed, but only abnormal results are displayed) Labs Reviewed  CBC WITH DIFFERENTIAL/PLATELET - Abnormal; Notable for the following components:      Result Value   WBC 13.8 (*)    RBC 5.88 (*)    HCT 52.1 (*)    Neutro Abs 9.8 (*)    Eosinophils Absolute 0.7 (*)    Abs Immature Granulocytes 0.11 (*)    All other components within normal limits  COMPREHENSIVE METABOLIC PANEL - Abnormal; Notable for the following components:   Creatinine, Ser 1.29 (*)    All other components within normal limits  CULTURE, BLOOD (ROUTINE X 2)  CULTURE, BLOOD (ROUTINE X 2)  SARS CORONAVIRUS 2 (TAT 6-24 HRS)  LACTIC ACID, PLASMA  HIV ANTIBODY (ROUTINE TESTING W REFLEX)    EKG None  Radiology CT PELVIS W CONTRAST  Result Date: 03/30/2020 CLINICAL DATA:  Anal or rectal abscess.  Hemorrhoids on 01/14. EXAM: CT PELVIS WITH CONTRAST TECHNIQUE: Multidetector  CT imaging of the pelvis was performed using the standard protocol following the bolus administration of intravenous contrast. CONTRAST:  OMNIPAQUE IOHEXOL 300 MG/ML  SOLN COMPARISON:  03/26/2020 FINDINGS: Urinary Tract: No bladder wall thickening, stone, or filling defect. Bowel: Visualized portions of small and large bowel are unremarkable. Appendix is normal. Vascular/Lymphatic: Iliac vessels are mildly calcified. No aneurysm. Reproductive: Prostate gland is enlarged, measuring 5.4 cm diameter. Other: Infiltration and skin thickening along the gluteal crease bilaterally with stranding into the adjacent fat. Similar changes to previous study although there is slightly increased infiltration. No developing collections. Scattered groin and pelvic lymph nodes are not pathologically enlarged, likely reactive. Musculoskeletal: No acute bony abnormalities. No cortical erosion or sclerosis to suggest osteomyelitis. IMPRESSION: 1. Infiltration and skin thickening along the gluteal crease bilaterally with stranding into the adjacent fat. Similar changes to previous study although there is slightly increased infiltration into the adjacent fat. No developing collections. 2. Enlarged prostate gland. Electronically Signed   By: Burman Nieves M.D.   On: 03/30/2020 00:06    Procedures Procedures (including critical care time)  Medications Ordered in ED Medications  piperacillin-tazobactam (ZOSYN) IVPB 3.375 g (has no administration in time range)  0.9 %  sodium chloride infusion (has no administration in time range)  HYDROcodone-acetaminophen (NORCO/VICODIN) 5-325 MG per tablet 1 tablet (1 tablet Oral Given 03/29/20 1804)  vancomycin (VANCOCIN) IVPB 1000 mg/200 mL premix (1,000 mg Intravenous New Bag/Given 03/30/20 0020)  diphenhydrAMINE (BENADRYL) capsule 25 mg (25 mg Oral Given 03/29/20 2344)  iohexol (OMNIPAQUE) 300 MG/ML solution 100 mL (100 mLs Intravenous Contrast Given 03/29/20 2349)    ED Course  I  have reviewed the triage vital signs and the nursing notes.  Pertinent labs & imaging results that were available during my care of the patient were reviewed by me and considered in my medical decision making (see chart for details).    MDM Rules/Calculators/A&P  Patient presents with concerns for worsening drainage and skin infection involving the buttock and perineal area.  He is nontoxic and afebrile.  He has extensive lichenification and skin thickening of the bilateral buttock into the perineum and lower scrotum.  There is also ulceration and drainage noted.  This is highly concerning.  It is atypical.  He denies any history of autoimmune disease or inflammatory bowel disease.  Denies history of HIV.  Work-up initiated.  He has a white count of 13.8 with a left shift.  He is afebrile with a normal lactate.  Doubt sepsis.  Blood cultures are pending.  HIV was sent.  Patient was given vancomycin and Zosyn.  Repeat CT scan was ordered given location and progression of symptoms to rule out Fournier's.  There is no gas noted in the tissue.  There is slight progression into the fatty layer.  I am concerned about the location and rapid progression.  For this reason recommended admission for IV antibiotics.  Spoke with Dr. Toniann FailKakrakandy who will admit the patient. Final Clinical Impression(s) / ED Diagnoses Final diagnoses:  Cellulitis, perineum    Rx / DC Orders ED Discharge Orders    None       Shon BatonHorton, Lilyian Quayle F, MD 03/30/20 [phone]

## 2020-03-30 DIAGNOSIS — Z0184 Encounter for antibody response examination: Secondary | ICD-10-CM | POA: Diagnosis not present

## 2020-03-30 DIAGNOSIS — K2100 Gastro-esophageal reflux disease with esophagitis, without bleeding: Secondary | ICD-10-CM

## 2020-03-30 DIAGNOSIS — L03317 Cellulitis of buttock: Secondary | ICD-10-CM | POA: Diagnosis present

## 2020-03-30 DIAGNOSIS — K219 Gastro-esophageal reflux disease without esophagitis: Secondary | ICD-10-CM | POA: Diagnosis present

## 2020-03-30 DIAGNOSIS — F17210 Nicotine dependence, cigarettes, uncomplicated: Secondary | ICD-10-CM | POA: Diagnosis present

## 2020-03-30 DIAGNOSIS — K5900 Constipation, unspecified: Secondary | ICD-10-CM | POA: Diagnosis present

## 2020-03-30 DIAGNOSIS — Z8249 Family history of ischemic heart disease and other diseases of the circulatory system: Secondary | ICD-10-CM | POA: Diagnosis not present

## 2020-03-30 DIAGNOSIS — E669 Obesity, unspecified: Secondary | ICD-10-CM | POA: Diagnosis present

## 2020-03-30 DIAGNOSIS — K449 Diaphragmatic hernia without obstruction or gangrene: Secondary | ICD-10-CM | POA: Diagnosis present

## 2020-03-30 DIAGNOSIS — N5089 Other specified disorders of the male genital organs: Secondary | ICD-10-CM | POA: Diagnosis present

## 2020-03-30 DIAGNOSIS — L03315 Cellulitis of perineum: Secondary | ICD-10-CM | POA: Diagnosis present

## 2020-03-30 DIAGNOSIS — Z79899 Other long term (current) drug therapy: Secondary | ICD-10-CM | POA: Diagnosis not present

## 2020-03-30 DIAGNOSIS — Z8241 Family history of sudden cardiac death: Secondary | ICD-10-CM | POA: Diagnosis not present

## 2020-03-30 DIAGNOSIS — Z20822 Contact with and (suspected) exposure to covid-19: Secondary | ICD-10-CM | POA: Diagnosis present

## 2020-03-30 DIAGNOSIS — N400 Enlarged prostate without lower urinary tract symptoms: Secondary | ICD-10-CM | POA: Diagnosis present

## 2020-03-30 DIAGNOSIS — L0591 Pilonidal cyst without abscess: Secondary | ICD-10-CM | POA: Diagnosis present

## 2020-03-30 LAB — HEMOGLOBIN A1C
Hgb A1c MFr Bld: 5.4 % (ref 4.8–5.6)
Mean Plasma Glucose: 108.28 mg/dL

## 2020-03-30 LAB — SARS CORONAVIRUS 2 (TAT 6-24 HRS): SARS Coronavirus 2: NEGATIVE

## 2020-03-30 LAB — TSH: TSH: 6.874 u[IU]/mL — ABNORMAL HIGH (ref 0.350–4.500)

## 2020-03-30 LAB — C-REACTIVE PROTEIN: CRP: 0.7 mg/dL (ref <1.0)

## 2020-03-30 LAB — HIV ANTIBODY (ROUTINE TESTING W REFLEX): HIV Screen 4th Generation wRfx: NONREACTIVE

## 2020-03-30 MED ORDER — MORPHINE SULFATE (PF) 4 MG/ML IV SOLN
4.0000 mg | Freq: Once | INTRAVENOUS | Status: AC
  Administered 2020-03-30: 4 mg via INTRAVENOUS
  Filled 2020-03-30: qty 1

## 2020-03-30 MED ORDER — DOXYCYCLINE HYCLATE 100 MG PO TABS
100.0000 mg | ORAL_TABLET | Freq: Two times a day (BID) | ORAL | Status: DC
  Administered 2020-03-30 – 2020-03-31 (×3): 100 mg via ORAL
  Filled 2020-03-30 (×3): qty 1

## 2020-03-30 MED ORDER — ACETAMINOPHEN 650 MG RE SUPP: 650.0000 mg | Freq: Four times a day (QID) | RECTAL | Status: DC | PRN

## 2020-03-30 MED ORDER — SODIUM CHLORIDE 0.9 % IV SOLN
3.0000 g | Freq: Four times a day (QID) | INTRAVENOUS | Status: DC
  Administered 2020-03-30 – 2020-03-31 (×5): 3 g via INTRAVENOUS
  Filled 2020-03-30: qty 8
  Filled 2020-03-30 (×2): qty 3
  Filled 2020-03-30: qty 8
  Filled 2020-03-30: qty 3

## 2020-03-30 MED ORDER — ACETAMINOPHEN 325 MG PO TABS
650.0000 mg | ORAL_TABLET | Freq: Four times a day (QID) | ORAL | Status: DC | PRN
  Administered 2020-03-31: 650 mg via ORAL
  Filled 2020-03-30: qty 2

## 2020-03-30 MED ORDER — DOCUSATE SODIUM 100 MG PO CAPS
100.0000 mg | ORAL_CAPSULE | Freq: Two times a day (BID) | ORAL | Status: DC
  Administered 2020-03-30 – 2020-03-31 (×3): 100 mg via ORAL
  Filled 2020-03-30 (×3): qty 1

## 2020-03-30 MED ORDER — SODIUM CHLORIDE 0.9 % IV SOLN: INTRAVENOUS | Status: DC | PRN

## 2020-03-30 MED ORDER — BISACODYL 10 MG RE SUPP
10.0000 mg | Freq: Every day | RECTAL | Status: DC | PRN
Start: 2020-03-30 — End: 2020-03-31

## 2020-03-30 MED ORDER — ZINC OXIDE 40 % EX OINT
TOPICAL_OINTMENT | Freq: Two times a day (BID) | CUTANEOUS | Status: DC
  Administered 2020-03-30: 1 via TOPICAL
  Filled 2020-03-30: qty 57

## 2020-03-30 MED ORDER — PANTOPRAZOLE SODIUM 40 MG PO TBEC
40.0000 mg | DELAYED_RELEASE_TABLET | Freq: Every day | ORAL | Status: DC
  Administered 2020-03-30 – 2020-03-31 (×2): 40 mg via ORAL
  Filled 2020-03-30 (×3): qty 1

## 2020-03-30 MED ORDER — POLYETHYLENE GLYCOL 3350 17 G PO PACK: 17.0000 g | PACK | Freq: Every day | ORAL | Status: DC | PRN

## 2020-03-30 MED ORDER — ENOXAPARIN SODIUM 40 MG/0.4ML ~~LOC~~ SOLN
40.0000 mg | SUBCUTANEOUS | Status: DC
  Administered 2020-03-30: 09:00:00 40 mg via SUBCUTANEOUS
  Filled 2020-03-30 (×2): qty 0.4

## 2020-03-30 MED ORDER — DIPHENHYDRAMINE HCL 25 MG PO CAPS
25.0000 mg | ORAL_CAPSULE | Freq: Once | ORAL | Status: AC
  Administered 2020-03-30: 13:00:00 25 mg via ORAL
  Filled 2020-03-30: qty 1

## 2020-03-30 MED ORDER — TAMSULOSIN HCL 0.4 MG PO CAPS
0.8000 mg | ORAL_CAPSULE | Freq: Every day | ORAL | Status: DC
  Administered 2020-03-30 – 2020-03-31 (×2): 0.8 mg via ORAL
  Filled 2020-03-30 (×2): qty 2

## 2020-03-30 MED ORDER — PIPERACILLIN-TAZOBACTAM 3.375 G IVPB 30 MIN
3.3750 g | Freq: Once | INTRAVENOUS | Status: AC
  Administered 2020-03-30: 3.375 g via INTRAVENOUS
  Filled 2020-03-30: qty 50

## 2020-03-30 MED ORDER — HYDROCODONE-ACETAMINOPHEN 5-325 MG PO TABS
1.0000 | ORAL_TABLET | ORAL | Status: DC | PRN
Start: 2020-03-30 — End: 2020-03-31
  Administered 2020-03-30 (×2): 2 via ORAL
  Filled 2020-03-30 (×3): qty 2

## 2020-03-30 NOTE — Consult Note (Addendum)
[facility] Surgery Consult Note  Anthony Farley 1975-06-23  [id number].    Requesting MD: Earney Mallet, MD  Chief Complaint/Reason for Consult: perianal cellulitis, drainage  HPI:  Mr. Anthony Farley is a 45 y/o M with a PMH BPH, GERD, obesity, and multiple pilonidal cyst excisions who presented to the hospital with a cc increased perineal pain and drainage. He reports noticing a hemorrhoid and increased pain around the time of the snow last weekend (1/15). Once the weather cleared up he presented to [facility] for evaluation and they referred him to our [facility] office where he was seen thurs/friday of last week along with CT scan 03/26/20. Our office did not think he had any acute surgical needs but did recommend follow up at a tertiary center for chronic perianal disease. He presented to the ED again with worsening pain/drainage. He denies a previous colonoscopy - stating he MIGHT have had one when he was 45 years old after an episode of rectal bleeding. Denies being seen by a GI physician in the past. He reports having 2 non-bloody stools over the last 1-1.5 weeks. Denies diarrhea or urinary sxs.  ROS: Review of Systems  Gastrointestinal: Positive for constipation.  All other systems reviewed and are negative.  Family History  Problem Relation Age of Onset  . Heart disease Father   . Diabetes Paternal Grandmother     Past Medical History:  Diagnosis Date  . BPH (benign prostatic hyperplasia)   . BPH (benign prostatic hyperplasia)   . Family history of sudden cardiac death in father   . GERD (gastroesophageal reflux disease)   . Hiatal hernia   . History of kidney stones   . Sebaceous cyst    perineum    Past Surgical History:  Procedure Laterality Date  . COLONOSCOPY WITH ESOPHAGOGASTRODUODENOSCOPY (EGD)  06/13/2016  . EXCISION OF KELOID N/A 06/19/2019   Procedure: EXCISION OF PERIANAL CYST, COMPLEX CLOSURE OF 8 CM WOUND;  Surgeon: Kinsinger, De Blanch, MD;  Location: [facility];  Service: General;  Laterality: N/A;  . MASS EXCISION  11/18/2011   Procedure: MINOR EXCISION OF MASS;  Surgeon: Ernestene Mention, MD;  Location: [facility];  Service: General;  Laterality: N/A;  excise scrotal mass  . PILONIDAL CYST EXCISION  x3  last one on 04-30-2004  @[facility]     Social History:  reports that he has been smoking cigarettes. He has a 27.00 pack-year smoking history. He has never used smokeless tobacco. He reports that he does not drink alcohol and does not use drugs.  Allergies: No Known Allergies  Medications Prior to Admission  Medication Sig Dispense Refill  . docusate sodium (COLACE) 100 MG capsule Take 1 capsule (100 mg total) by mouth every 12 (twelve) hours. 30 capsule 0  . hydrocortisone (ANUSOL-HC) 2.5 % rectal cream Place 1 application rectally 4 (four) times daily as needed for hemorrhoids or anal itching.    . hydrocortisone (ANUSOL-HC) 25 MG suppository Place 25 mg rectally 2 (two) times daily.    lidocaine (XYLOCAINE) 5 % ointment Apply 1 application topically 4 (four) times daily as needed. (Patient taking differently: Apply 1 application topically 4 (four) times daily as needed for mild pain.) 50 g 0  . meloxicam (MOBIC) 15 MG tablet Take 1 tablet (15 mg total) by mouth daily. 30 tablet 1  . neomycin-bacitracin-polymyxin (NEOSPORIN) ointment Apply 1 application topically as needed for wound care.    . RABEprazole (ACIPHEX) 20 MG tablet Take 20 mg  by mouth daily.    . tamsulosin (FLOMAX) 0.4 MG CAPS capsule Take 0.8 mg by mouth daily.      Blood pressure 131/78, pulse 85, temperature 98.3 F (36.8 C), temperature source Oral, resp. rate 18, height 5\' 8"  (1.727 m), weight 95.3 kg, SpO2 100 %. Physical Exam: Constitutional: NAD; conversant; no deformities Eyes: Moist conjunctiva; no lid lag; anicteric; PERRL Neck: Trachea midline; no thyromegaly Lungs: Normal respiratory effort; no tactile fremitus CV: RRR; no palpable thrills; no  pitting edema GI: Abd soft and nontender no palpable hepatosplenomegaly GU: there is a small draining sinus centrally just above the intergluteal cleft. within the intergluteal cleft is a small amt purulent drainage along with some skin breakdown that spans most of the gluteal cleft into the perineal and scrotal area. There is mild induration of the scrotum. There is no palpable fluctuance, no blanching erythema. Non-inflamed, small hemorrhoids are present. MSK: symmetrical; no clubbing/cyanosis Psychiatric: Appropriate affect; alert and oriented x3 Lymphatic: No palpable cervical or axillary lymphadenopathy  Results for orders placed or performed during the hospital encounter of 03/29/20 (from the past 48 hour(s))  CBC with Differential/Platelet     Status: Abnormal   Collection Time: 03/29/20  6:05 PM  Result Value Ref Range   WBC 13.8 (H) 4.0 - 10.5 K/uL   RBC 5.88 (H) 4.22 - 5.81 MIL/uL   Hemoglobin 16.9 13.0 - 17.0 g/dL   HCT 03/31/20 (H) 67.3 - 41.9 %   MCV 88.6 80.0 - 100.0 fL   MCH 28.7 26.0 - 34.0 pg   MCHC 32.4 30.0 - 36.0 g/dL   RDW 37.9 02.4 - 09.7 %   Platelets 217 150 - 400 K/uL    Comment: REPEATED TO VERIFY SPECIMEN CHECKED FOR CLOTS    nRBC 0.0 0.0 - 0.2 %   Neutrophils Relative % 71 %   Neutro Abs 9.8 (H) 1.7 - 7.7 K/uL   Lymphocytes Relative 17 %   Lymphs Abs 2.3 0.7 - 4.0 K/uL   Monocytes Relative 6 %   Monocytes Absolute 0.9 0.1 - 1.0 K/uL   Eosinophils Relative 5 %   Eosinophils Absolute 0.7 (H) 0.0 - 0.5 K/uL   Basophils Relative 0 %   Basophils Absolute 0.1 0.0 - 0.1 K/uL   Immature Granulocytes 1 %   Abs Immature Granulocytes 0.11 (H) 0.00 - 0.07 K/uL    Comment: Performed at [facility], [address] [place], [place]  Comprehensive metabolic panel     Status: Abnormal   Collection Time: 03/29/20  6:05 PM  Result Value Ref Range   Sodium 141 135 - 145 mmol/L   Potassium 4.2 3.5 - 5.1 mmol/L   Chloride 105 98 - 111 mmol/L   CO2  25 22 - 32 mmol/L   Glucose, Bld 85 70 - 99 mg/dL    Comment: Glucose reference range applies only to samples taken after fasting for at least 8 hours.   BUN 18 6 - 20 mg/dL   Creatinine, Ser 03/31/20 (H) 0.61 - 1.24 mg/dL   Calcium 9.6 8.9 - 2.68 mg/dL   Total Protein 7.3 6.5 - 8.1 g/dL   Albumin 4.2 3.5 - 5.0 g/dL   AST 20 15 - 41 U/L   ALT 14 0 - 44 U/L   Alkaline Phosphatase 76 38 - 126 U/L   Total Bilirubin 0.5 0.3 - 1.2 mg/dL   GFR, Estimated 34.1 >96 mL/min    Comment: (NOTE) Calculated using the CKD-EPI  Creatinine Equation (2021)    Anion gap 11 5 - 15    Comment: Performed at [facility], [address] [place], [place] [address]  Lactic acid, plasma     Status: None   Collection Time: 03/29/20  6:05 PM  Result Value Ref Range   Lactic Acid, Venous 1.5 0.5 - 1.9 mmol/L    Comment: Performed at [facility], [address] [place], [place] [address]  Blood culture (routine x 2)     Status: None (Preliminary result)   Collection Time: 03/29/20  6:24 PM   Specimen: Left Antecubital; Blood  Result Value Ref Range   Specimen Description      LEFT ANTECUBITAL BLOOD Performed at [facility], [address], [place], [place] [address]    Special Requests      BOTTLES DRAWN AEROBIC AND ANAEROBIC Blood Culture adequate volume Performed at [facility], [address] [place], [place] [address]    Culture PENDING    Report Status PENDING   C-reactive protein     Status: None   Collection Time: 03/30/20  8:28 AM  Result Value Ref Range   CRP 0.7 <1.0 mg/dL    Comment: Performed at [facility], [address], [place], [place] [address]   CT PELVIS W CONTRAST  Result Date: 03/30/2020 CLINICAL DATA:  Anal or rectal abscess.  Hemorrhoids on 01/14. EXAM: CT PELVIS WITH CONTRAST TECHNIQUE: Multidetector CT imaging of the pelvis was performed using the standard protocol following the bolus administration of intravenous  contrast. CONTRAST:  OMNIPAQUE IOHEXOL 300 MG/ML  SOLN COMPARISON:  03/26/2020 FINDINGS: Urinary Tract: No bladder wall thickening, stone, or filling defect. Bowel: Visualized portions of small and large bowel are unremarkable. Appendix is normal. Vascular/Lymphatic: Iliac vessels are mildly calcified. No aneurysm. Reproductive: Prostate gland is enlarged, measuring 5.4 cm diameter. Other: Infiltration and skin thickening along the gluteal crease bilaterally with stranding into the adjacent fat. Similar changes to previous study although there is slightly increased infiltration. No developing collections. Scattered groin and pelvic lymph nodes are not pathologically enlarged, likely reactive. Musculoskeletal: No acute bony abnormalities. No cortical erosion or sclerosis to suggest osteomyelitis. IMPRESSION: 1. Infiltration and skin thickening along the gluteal crease bilaterally with stranding into the adjacent fat. Similar changes to previous study although there is slightly increased infiltration into the adjacent fat. No developing collections. 2. Enlarged prostate gland. Electronically Signed   By: Burman Nieves M.D.   On: 03/30/2020 00:06    Assessment/Plan Acute on chronic perianal inflammatory changes with a known history of pilonidal disease  - afebrile, vitals stable, WBC 13 - no acute surgical needs, no undrained abscesses or fluid collections appreciable on exam or on CT scan 1/20, 1/24 - sitz baths TID and PRN - continue abx. Will discuss duration of therapy with MD. No wound cultures were taken. - WOC RN consult for assistance in managing skin breakdown within the intergluteal cleft -  Patient may benefit from outpatient follow up with one of our colorectal surgeons and/or a gastroenterologist to further evaluate why his perianal disease continues to recur. Will discuss with attending MD and make further recommendations.    Adam Phenix, PA-C [facility] Please  see Amion for pager number during day hours 7:00am-4:30pm 03/30/2020, 10:07 AM

## 2020-03-30 NOTE — Consult Note (Signed)
WOC Nurse Consult Note: Reason for Consult: intragluteal skin irritation from pilonidal cyst drainage, also scrotal irritation Wound type:contact irritant dermatitis due to wound exudate Pressure Injury POA: N/A Measurement: 2cm x 0.5cm (unable to assess depth) Wound bed: red, wet Drainage (amount, consistency, odor) serous to light yellow Periwound:erythematous, indurated Dressing procedure/placement/frequency: I have consulted with Dr. Gerrit Friends and Betha Loa, [facility] PA and will implement a skin barrier using zinc oxide (Desitin). Patent will also receive a mattress replacement with low air loss feature as we attempt to dry the affected areas. He is primarily in a side lying position due to discomfort with knees bent and this is contributing to exudate pooling at the gluteal cleft and scrotal areas. For ambulation, I suggest using an ABD pad inside mesh briefs.  WOC nursing team will not follow, but will remain available to this patient, the nursing and medical teams.  Please re-consult if needed. Thanks, Ladona Mow, MSN, RN, GNP, [place]  Pager# [phone]

## 2020-03-30 NOTE — H&P (Addendum)
History and Physical    Anthony Farley TKZ:[id number] DOB: 1975-12-18 DOA: 03/29/2020  PCP: Patient, No Pcp Per   Patient coming from: [place] urgent care   Chief Complaint: serous drainage from the perineal area   HPI: Anthony Farley is a 45 y.o. male with BPH, GERD, obesity and pilonidal cyst operated by [facility] 2021 was sent from [facility] urgent care for inpatient treatment of cellulitis perineum, buttock area.  Pt reports that noticed a thrombosed hemorrhoid 03/23/20. Presented to [facility] ED and was discharge with lidocaine ointment and Surgical referral.   He was seen by [facility] 03/24/2018 and pursued evaluation with a CT scan. He returned to them for post CT follow-up on 03/27/2019 and was told that needs to be referred to [facility] surgery for further care in light of CT findings.   Since then he has noticed increase seropurulent discharge from the perineal area most notably, from the posterior aspect of the intergluteal crease and posterior aspect of scrotum.   He tried his mother's doxycycline 100 mg 3 times daily for 3 days without significant relief.  Denies any fever chills.  No nausea vomiting diarrhea.  No abdominal pain.  No change in color of stools.   Denies any history of HIV or STDs.    ED Course:  WBC 13.8, Hb 16.9, platelets 217 -Sodium 141, potassium 4.2, BUN 18, creatinine 1.29.  Normal LFTs. -Lactic acid 1.5 -HIV antibody and blood cultures pending -CT abdomen with infiltration and skin thickening along the gluteal crease bilaterally with stranding into adjacent fat.  Similar changes to previous study although there is slightly increased infiltration to adjacent fat.  No developing collections.   Meds given: -Vanco and Zosyn  Review of Systems: As per HPI otherwise 10 point review of systems negative.    Past Medical History:  Diagnosis Date  . BPH (benign prostatic hyperplasia)   . BPH (benign prostatic hyperplasia)   . Family history of  sudden cardiac death in father   . GERD (gastroesophageal reflux disease)   . Hiatal hernia   . History of kidney stones   . Sebaceous cyst    perineum    Past Surgical History:  Procedure Laterality Date  . COLONOSCOPY WITH ESOPHAGOGASTRODUODENOSCOPY (EGD)  06/13/2016  . EXCISION OF KELOID N/A 06/19/2019   Procedure: EXCISION OF PERIANAL CYST, COMPLEX CLOSURE OF 8 CM WOUND;  Surgeon: Kinsinger, De Blanch, MD;  Location: [facility];  Service: General;  Laterality: N/A;  . MASS EXCISION  11/18/2011   Procedure: MINOR EXCISION OF MASS;  Surgeon: Ernestene Mention, MD;  Location: [facility];  Service: General;  Laterality: N/A;  excise scrotal mass  . PILONIDAL CYST EXCISION  x3  last one on 04-30-2004  @[facility]      reports that he has been smoking cigarettes. He has a 27.00 pack-year smoking history. He has never used smokeless tobacco. He reports that he does not drink alcohol and does not use drugs.  No Known Allergies  Family History  Problem Relation Age of Onset  . Heart disease Father   . Diabetes Paternal Grandmother      Prior to Admission medications   Medication Sig Start Date End Date Taking? Authorizing Provider  amoxicillin-clavulanate (AUGMENTIN) 875-125 MG tablet Take 1 tablet by mouth every 12 (twelve) hours. 06/19/19   Kinsinger, 06/21/19, MD  docusate sodium (COLACE) 100 MG capsule Take 1 capsule (100 mg total) by mouth every 12 (twelve) hours. 03/24/20  Arthor Captain, PA-C  famotidine (PEPCID) 20 MG tablet Take 20 mg by mouth as needed for heartburn or indigestion.    [provider]  hydrocortisone (ANUSOL-HC) 25 MG suppository Place 25 mg rectally 2 (two) times daily. 03/25/20   [provider]  ibuprofen (ADVIL) 800 MG tablet Take 1 tablet (800 mg total) by mouth every 8 (eight) hours as needed. 06/19/19   Kinsinger, De Blanch, MD  lidocaine (XYLOCAINE) 5 % ointment Apply 1 application topically 4 (four) times daily  as needed. 03/24/20   Arthor Captain, PA-C  meloxicam (MOBIC) 15 MG tablet Take 1 tablet (15 mg total) by mouth daily. 02/07/20   Felecia Shelling, DPM  oxyCODONE (OXY IR/ROXICODONE) 5 MG immediate release tablet Take 1 tablet (5 mg total) by mouth every 6 (six) hours as needed for severe pain. 06/19/19   Kinsinger, De Blanch, MD  RABEprazole (ACIPHEX) 20 MG tablet Take 20 mg by mouth at bedtime.    [provider]  tamsulosin (FLOMAX) 0.4 MG CAPS capsule Take 0.8 mg by mouth at bedtime.    [provider]  varenicline (CHANTIX) 1 MG tablet Take by mouth. 10/31/19   [provider]    Physical Exam: Vitals:   03/30/20 0223 03/30/20 0406 03/30/20 0509 03/30/20 0721  BP: (!) 125/91 132/81 (!) 130/92   Pulse: 72 79 88   Resp: 14 18 17    Temp:  98.1 F (36.7 C) 98.3 F (36.8 C)   TempSrc:  Oral Oral   SpO2: 97% 100% 99%   Weight:    95.3 kg  Height:    5\' 8"  (1.727 m)    Constitutional: NAD, calm, comfortable Vitals:   03/30/20 0223 03/30/20 0406 03/30/20 0509 03/30/20 0721  BP: (!) 125/91 132/81 (!) 130/92   Pulse: 72 79 88   Resp: 14 18 17    Temp:  98.1 F (36.7 C) 98.3 F (36.8 C)   TempSrc:  Oral Oral   SpO2: 97% 100% 99%   Weight:    95.3 kg  Height:    5\' 8"  (1.727 m)   Eyes: PERRL, lids and conjunctivae normal ENMT: Mucous membranes are moist. Posterior pharynx clear of any exudate or lesions.Normal dentition.  Neck: normal, supple, no masses, no thyromegaly Respiratory: clear to auscultation bilaterally, no wheezing, no crackles. Normal respiratory effort. No accessory muscle use.  Cardiovascular: Regular rate and rhythm, no murmurs / rubs / gallops. No extremity edema. 2+ pedal pulses. No carotid bruits.  Abdomen: no tenderness, no masses palpated. No hepatosplenomegaly. Bowel sounds positive.   Serous discharge noted in the intergluteal cleft with maceration of the surrounding area.  Musculoskeletal: no clubbing / cyanosis. No joint deformity  upper and lower extremities. Good ROM, no contractures. Normal muscle tone.  Skin: no rashes, lesions, ulcers. No induration Neurologic: CN 2-12 grossly intact. Sensation intact, DTR normal. Strength 5/5 in all 4.  Psychiatric: Normal judgment and insight. Alert and oriented x 3. Normal mood.    Labs on Admission: I have personally reviewed following labs and imaging studies  CBC: Recent Labs  Lab 03/29/20 1805  WBC 13.8*  NEUTROABS 9.8*  HGB 16.9  HCT 52.1*  MCV 88.6  PLT 217   Basic Metabolic Panel: Recent Labs  Lab 03/29/20 1805  NA 141  K 4.2  CL 105  CO2 25  GLUCOSE 85  BUN 18  CREATININE 1.29*  CALCIUM 9.6   GFR: Estimated Creatinine Clearance: 80.2 mL/min (A) (by C-G formula based on SCr  of 1.29 mg/dL (H)). Liver Function Tests: Recent Labs  Lab 03/29/20 1805  AST 20  ALT 14  ALKPHOS 76  BILITOT 0.5  PROT 7.3  ALBUMIN 4.2   Urine analysis:    Component Value Date/Time   COLORURINE YELLOW 08/24/2018 1730   APPEARANCEUR CLEAR 08/24/2018 1730   LABSPEC 1.024 08/24/2018 1730   PHURINE 5.0 08/24/2018 1730   GLUCOSEU NEGATIVE 08/24/2018 1730   HGBUR SMALL (A) 08/24/2018 1730   BILIRUBINUR NEGATIVE 08/24/2018 1730   KETONESUR 5 (A) 08/24/2018 1730   PROTEINUR NEGATIVE 08/24/2018 1730   UROBILINOGEN 1.0 02/10/2013 1004   NITRITE NEGATIVE 08/24/2018 1730   LEUKOCYTESUR NEGATIVE 08/24/2018 1730    Radiological Exams on Admission: CT PELVIS W CONTRAST  Result Date: 03/30/2020 CLINICAL DATA:  Anal or rectal abscess.  Hemorrhoids on 01/14. EXAM: CT PELVIS WITH CONTRAST TECHNIQUE: Multidetector CT imaging of the pelvis was performed using the standard protocol following the bolus administration of intravenous contrast. CONTRAST:  OMNIPAQUE IOHEXOL 300 MG/ML  SOLN COMPARISON:  03/26/2020 FINDINGS: Urinary Tract: No bladder wall thickening, stone, or filling defect. Bowel: Visualized portions of small and large bowel are unremarkable. Appendix is normal.  Vascular/Lymphatic: Iliac vessels are mildly calcified. No aneurysm. Reproductive: Prostate gland is enlarged, measuring 5.4 cm diameter. Other: Infiltration and skin thickening along the gluteal crease bilaterally with stranding into the adjacent fat. Similar changes to previous study although there is slightly increased infiltration. No developing collections. Scattered groin and pelvic lymph nodes are not pathologically enlarged, likely reactive. Musculoskeletal: No acute bony abnormalities. No cortical erosion or sclerosis to suggest osteomyelitis. IMPRESSION: 1. Infiltration and skin thickening along the gluteal crease bilaterally with stranding into the adjacent fat. Similar changes to previous study although there is slightly increased infiltration into the adjacent fat. No developing collections. 2. Enlarged prostate gland. Electronically Signed   By: Burman Nieves M.D.   On: 03/30/2020 00:06     Assessment/Plan Active Problems:   Cellulitis of buttock   Benign prostatic hyperplasia without lower urinary tract symptoms   Cigarette nicotine dependence without complication   Gastroesophageal reflux disease with esophagitis without hemorrhage   Obesity (BMI 30.0-34.9)   Cellulitis, perineum   #Cellulitis and discharge from the perineal area -CT pelvis only showing  cellulitis w/o abscess.  -Unasyn with doxycycline to cover MRSA, gram negatives and anaerobes. -[facility] consulted already to explore the causes of referral to [facility].  Questionable fistula? ? IBD.They will see the patient as well.  -Pain control with oxycodone.  -Follow-up HbA1c, daily CRP. F/u Bcx   #GERD -Continue with PPI  #BPH -Continue with Flomax   DVT prophylaxis: Lovenox  code Status: Full code Family Communication: None   Disposition Plan: Home   consults called: General surgery. Dr. Feliciana Rossetti.   Admission status: Inpatient.   Chelesa Weingartner MD [facility]   If  7PM-7AM, please contact night-coverage [email or web address] Password [facility]  03/30/2020, 7:50 AM

## 2020-03-30 NOTE — ED Notes (Signed)
Report given to Pharmacologist at [employer].

## 2020-03-31 LAB — COMPREHENSIVE METABOLIC PANEL
ALT: 12 U/L (ref 0–44)
AST: 14 U/L — ABNORMAL LOW (ref 15–41)
Albumin: 3.7 g/dL (ref 3.5–5.0)
Alkaline Phosphatase: 59 U/L (ref 38–126)
Anion gap: 12 (ref 5–15)
BUN: 16 mg/dL (ref 6–20)
CO2: 23 mmol/L (ref 22–32)
Calcium: 9 mg/dL (ref 8.9–10.3)
Chloride: 105 mmol/L (ref 98–111)
Creatinine, Ser: 1.07 mg/dL (ref 0.61–1.24)
GFR, Estimated: >60 mL/min (ref >60)
Glucose, Bld: 95 mg/dL (ref 70–99)
Potassium: 3.5 mmol/L (ref 3.5–5.1)
Sodium: 140 mmol/L (ref 135–145)
Total Bilirubin: 0.8 mg/dL (ref 0.3–1.2)
Total Protein: 6.4 g/dL — ABNORMAL LOW (ref 6.5–8.1)

## 2020-03-31 LAB — CBC WITH DIFFERENTIAL/PLATELET
Abs Immature Granulocytes: 0.08 10*3/uL — ABNORMAL HIGH (ref 0.00–0.07)
Basophils Absolute: 0.1 10*3/uL (ref 0.0–0.1)
Basophils Relative: 1 %
Eosinophils Absolute: 0.8 10*3/uL — ABNORMAL HIGH (ref 0.0–0.5)
Eosinophils Relative: 8 %
HCT: 49.1 % (ref 39.0–52.0)
Hemoglobin: 15.3 g/dL (ref 13.0–17.0)
Immature Granulocytes: 1 %
Lymphocytes Relative: 18 %
Lymphs Abs: 1.9 10*3/uL (ref 0.7–4.0)
MCH: 28.4 pg (ref 26.0–34.0)
MCHC: 31.2 g/dL (ref 30.0–36.0)
MCV: 91.3 fL (ref 80.0–100.0)
Monocytes Absolute: 0.7 10*3/uL (ref 0.1–1.0)
Monocytes Relative: 7 %
Neutro Abs: 7.1 10*3/uL (ref 1.7–7.7)
Neutrophils Relative %: 65 %
Platelets: 210 10*3/uL (ref 150–400)
RBC: 5.38 MIL/uL (ref 4.22–5.81)
RDW: 13.2 % (ref 11.5–15.5)
WBC: 10.6 10*3/uL — ABNORMAL HIGH (ref 4.0–10.5)
nRBC: 0 % (ref 0.0–0.2)

## 2020-03-31 LAB — C-REACTIVE PROTEIN: CRP: 0.8 mg/dL (ref <1.0)

## 2020-03-31 MED ORDER — AMOXICILLIN-POT CLAVULANATE 875-125 MG PO TABS: 1.0000 | ORAL_TABLET | Freq: Two times a day (BID) | ORAL | 0 refills | Status: DC

## 2020-03-31 MED ORDER — DOXYCYCLINE HYCLATE 100 MG PO TABS: 100.0000 mg | ORAL_TABLET | Freq: Two times a day (BID) | ORAL | 0 refills | Status: DC

## 2020-03-31 MED ORDER — CLOTRIMAZOLE-BETAMETHASONE 1-0.05 % EX CREA: TOPICAL_CREAM | CUTANEOUS | 0 refills | Status: DC

## 2020-03-31 NOTE — Discharge Summary (Signed)
Physician Discharge Summary  Anthony Farley ZOX:[id number] DOB: [birth year] DOA: 03/29/2020  PCP: Patient, No Pcp Per  Admit date: 03/29/2020 Discharge date: 03/31/2020  Admitted From: Home  Disposition:  Home   Recommendations for Outpatient Follow-up:  1. [facility] in 1-2 weeks 2. Follow up with Anthony [facility] when scheduled        Home Health: None  Equipment/Devices: None new  Discharge Condition: Good   CODE STATUS: FULL Diet recommendation: Regular  Brief/Interim Summary: Mr. Anthony Farley is a 45 y.o. M with hx pilonidal cyst, BPH and no other significant PMHx who presented with 1 week progressive discomfort in the perineum, then extending to the scrotum and associated with drainage.  He had seen [facility] Cliffton [facility]White [facility] last week, and been referred to [facility] for consideration of excision and flap.    In the interim, his symptoms worsened, so he came to the ER.  In the ER, he had tachycardia, WBC 13K.  He was started on IV antibiotics and admitted.         PRINCIPAL HOSPITAL DIAGNOSIS: Pilonidal cyst with surrounding cellulitis    Discharge Diagnoses:  Pilonidal cyst with surrounding cellulitis Patient admitted and started on Unasyn and doxycycline.  WBC improved.  Pain improved.    WOC were consulted and recommended Desitin barrier, ABD pad with ambulating.  Gen Surg evaluated and recommended Sitz baths, antibiotics, outpatient follow up and referral to tertiary center for consideration of excision.         Discharge Instructions  Discharge Instructions    Diet - low sodium heart healthy   Complete by: As directed    Discharge instructions   Complete by: As directed    From Dr. Maryfrances Bunnellanford: You were evaluated for draining cyst.  For the cyst and skin irritation in the buttocks: Use a Sitz bath three times daily and as needed Apply Desitin barrier cream (over the counter) after every  sitz bath Put an ABD pad between the buttocks when walking, and put a mesh underwear over it   For the penis: This is called "balanitis" It is treated with a combination antifungal/antiyeast cream (clotrimazole) and a gentle anti-inflammatory (betamethasone) --> Lotrisone  Apply Lotrisone once daily in the morning for a few days and see if this helps  ALSO: For the overall swelling: Take the antibiotics Augmentin and doxycycline: Take Augmentin 875-125 mg twice daily Take doxycycline 100 mg twice daily  You should have six days of medicine, to complete a 1 week course of treatment  Your referrals to the tertiary care center are pending  Call Anthony [facility] to check on these  YOu should be seen in abuot 1 week This can be by your primary doctor, but if you don't have a primary, call [facility] and they will work you in  If you have fever, chills, worse swelling despite antibiotics and sitz baths, call Anthony [facility] or go to the ER   Increase activity slowly   Complete by: As directed      Allergies as of 03/31/2020   No Known Allergies     Medication List    TAKE these medications   amoxicillin-clavulanate 875-125 MG tablet Commonly known as: Augmentin Take 1 tablet by mouth 2 (two) times daily for 6 days.   Anusol-HC 2.5 % rectal cream Generic drug: hydrocortisone Place 1 application rectally 4 (four) times daily as needed for hemorrhoids or anal itching.   clotrimazole-betamethasone  cream Commonly known as: Lotrisone Apply to affected area 2 times daily   docusate sodium 100 MG capsule Commonly known as: COLACE Take 1 capsule (100 mg total) by mouth every 12 (twelve) hours.   doxycycline 100 MG tablet Commonly known as: VIBRA-TABS Take 1 tablet (100 mg total) by mouth every 12 (twelve) hours.   hydrocortisone 25 MG suppository Commonly known as: ANUSOL-HC Place 25 mg rectally 2 (two) times daily.   lidocaine 5 %  ointment Commonly known as: XYLOCAINE Apply 1 application topically 4 (four) times daily as needed. What changed: reasons to take this   meloxicam 15 MG tablet Commonly known as: MOBIC Take 1 tablet (15 mg total) by mouth daily.   neomycin-bacitracin-polymyxin ointment Commonly known as: NEOSPORIN Apply 1 application topically as needed for wound care.   RABEprazole 20 MG tablet Commonly known as: ACIPHEX Take 20 mg by mouth daily.   tamsulosin 0.4 MG Caps capsule Commonly known as: FLOMAX Take 0.8 mg by mouth daily.       Follow-up Information    [facility] Follow up.   Specialty: General [facility] Why: if you have not been contacted by Anthony or [facility] within one week, please call our office so we can follow up on that referral for you. referral was placed 03/27/2020. Contact information: [address] [place] [address] [phone]              No Known Allergies  Consultations:  General [facility]   Procedures/Studies: CT PELVIS W CONTRAST  Result Date: 03/30/2020 CLINICAL DATA:  Anal or rectal abscess.  Hemorrhoids on 01/14. EXAM: CT PELVIS WITH CONTRAST TECHNIQUE: Multidetector CT imaging of the pelvis was performed using the standard protocol following the bolus administration of intravenous contrast. CONTRAST:  OMNIPAQUE IOHEXOL 300 MG/ML  SOLN COMPARISON:  03/26/2020 FINDINGS: Urinary Tract: No bladder wall thickening, stone, or filling defect. Bowel: Visualized portions of small and large bowel are unremarkable. Appendix is normal. Vascular/Lymphatic: Iliac vessels are mildly calcified. No aneurysm. Reproductive: Prostate gland is enlarged, measuring 5.4 cm diameter. Other: Infiltration and skin thickening along the gluteal crease bilaterally with stranding into the adjacent fat. Similar changes to previous study although there is slightly increased infiltration. No developing collections. Scattered groin and pelvic lymph nodes are  not pathologically enlarged, likely reactive. Musculoskeletal: No acute bony abnormalities. No cortical erosion or sclerosis to suggest osteomyelitis. IMPRESSION: 1. Infiltration and skin thickening along the gluteal crease bilaterally with stranding into the adjacent fat. Similar changes to previous study although there is slightly increased infiltration into the adjacent fat. No developing collections. 2. Enlarged prostate gland. Electronically Signed   By: Burman Nieves M.D.   On: 03/30/2020 00:06   CT PELVIS W CONTRAST  Result Date: 03/26/2020 CLINICAL DATA:  Severe buttock pain and swelling. Reported thrombosed hemorrhoid. History of pilonidal cyst [facility] x5. EXAM: CT PELVIS WITH CONTRAST TECHNIQUE: Multidetector CT imaging of the pelvis was performed using the standard protocol following the bolus administration of intravenous contrast. CONTRAST:  OMNIPAQUE IOHEXOL 300 MG/ML  SOLN COMPARISON:  08/24/2018 CT abdomen/pelvis. FINDINGS: Urinary Tract: Normal nondistended bladder. Normal caliber pelvic ureters. Bowel: Normal caliber pelvic small and large bowel loops. No bowel wall thickening. No evidence of perirectal or perianal fluid collection. Vascular/Lymphatic: Mild bilateral iliofemoral atherosclerosis with no acute vascular abnormality in the pelvis. No pathologically enlarged pelvic nodes. Reproductive: Normal size prostate and seminal vesicles. Generalized mild scrotal edema without focal fluid collection or mass. Other: Mild  irregular skin thickening in the medial gluteal cleft bilaterally, which appears chronic and mildly worsened since 08/24/2018 CT. No associated subcutaneous or deep buttock fluid collections or soft tissue emphysema. Musculoskeletal: No aggressive appearing focal osseous lesions. IMPRESSION: Mild irregular skin thickening in the medial gluteal cleft bilaterally, which appears chronic and mildly worsened since 08/24/2018 CT. Generalized mild scrotal edema without focal  fluid collection. No pelvic abscess. Electronically Signed   By: Delbert PhenixJason A Poff M.D.   On: 03/26/2020 14:59       Subjective: Patient still has some pain, but the scrotal swelling is better, he has had no fever, his appetite is good.  Discharge Exam: Vitals:   03/30/20 2116 03/31/20 0456  BP: 112/61 136/81  Pulse: 83 (!) 101  Resp: 19 19  Temp: 98.4 F (36.9 C) 98.1 F (36.7 C)  SpO2: 99% 98%   Vitals:   03/30/20 1302 03/30/20 1731 03/30/20 2116 03/31/20 0456  BP: (!) 142/78 131/80 112/61 136/81  Pulse: 86 86 83 (!) 101  Resp:   19 19  Temp: (!) 97.5 F (36.4 C) 98.3 F (36.8 C) 98.4 F (36.9 C) 98.1 F (36.7 C)  TempSrc: Oral Oral Oral Oral  SpO2: 98% 90% 99% 98%  Weight:      Height:        General: Pt is alert, awake, not in acute distress Cardiovascular: RRR, nl S1-S2, no murmurs appreciated.   No LE edema.   Respiratory: Normal respiratory rate and rhythm.  CTAB without rales or wheezes. Abdominal: Abdomen soft and non-tender.  No distension or HSM.   GU: THe penis has mild edema of the foreskin with mild ulcerations of the glans/balanitis.  There is maceration of the gluteal cleft, no perineal induration or swelling. Neuro/Psych: Strength symmetric in upper and lower extremities.  Judgment and insight appear normal.   The results of significant diagnostics from this hospitalization (including imaging, microbiology, ancillary and laboratory) are listed below for reference.     Microbiology: Recent Results (from the past 240 hour(s))  Blood culture (routine x 2)     Status: None (Preliminary result)   Collection Time: 03/29/20  6:19 PM   Specimen: BLOOD RIGHT ARM  Result Value Ref Range Status   Specimen Description   Final    BLOOD RIGHT ARM Performed at [facility] [place], [address] [place], [place] [address]    Special Requests   Final    BOTTLES DRAWN AEROBIC AND ANAEROBIC Blood Culture adequate volume Performed at [facility] [place],  [address] [place], [place] [address]    Culture   Final    NO GROWTH < 24 HOURS Performed at [facility], [address] [place], [place] [address]    Report Status PENDING  Incomplete  Blood culture (routine x 2)     Status: None (Preliminary result)   Collection Time: 03/29/20  6:24 PM   Specimen: Left Antecubital; Blood  Result Value Ref Range Status   Specimen Description   Final    LEFT ANTECUBITAL BLOOD Performed at [facility], [address] [place], [place] [address]    Special Requests   Final    BOTTLES DRAWN AEROBIC AND ANAEROBIC Blood Culture adequate volume Performed at [facility] [place], [address] [place], [place] [address]    Culture   Final    NO GROWTH < 24 HOURS Performed at [facility], [address] [place] [address]    Report Status PENDING  Incomplete  SARS CORONAVIRUS 2 (TAT 6-24 HRS) Nasopharyngeal Nasopharyngeal Swab     Status: None   Collection Time: 03/30/20  2:44 AM   Specimen: Nasopharyngeal Swab  Result Value Ref Range Status   SARS Coronavirus 2 NEGATIVE NEGATIVE Final    Comment: (NOTE) SARS-CoV-2 target nucleic acids are NOT DETECTED.  The SARS-CoV-2 RNA is generally detectable in upper and lower respiratory specimens during the acute phase of infection. Negative results do not preclude SARS-CoV-2 infection, do not rule out co-infections with other pathogens, and should not be used as the sole basis for treatment or other patient management decisions. Negative results must be combined with clinical observations, patient history, and epidemiological information. The expected result is Negative.  Fact Sheet for Patients: HairSlick.no  Fact Sheet for Healthcare Providers: quierodirigir.com  This test is not yet approved or cleared by the [place] FDA and  has been authorized for detection and/or diagnosis of SARS-CoV-2 by FDA  under an Emergency Use Authorization (EUA). This EUA will remain  in effect (meaning this test can be used) for the duration of the COVID-19 declaration under Se ction 564(b)(1) of the Act, 21 U.S.C. section 360bbb-3(b)(1), unless the authorization is terminated or revoked sooner.  Performed at [facility], [address] [place], [place] [address]      Labs: BNP (last 3 results) No results for input(s): BNP in the last 8760 hours. Basic Metabolic Panel: Recent Labs  Lab 03/29/20 1805 03/31/20 0519  NA 141 140  K 4.2 3.5  CL 105 105  CO2 25 23  GLUCOSE 85 95  BUN 18 16  CREATININE 1.29* 1.07  CALCIUM 9.6 9.0   Liver Function Tests: Recent Labs  Lab 03/29/20 1805 03/31/20 0519  AST 20 14*  ALT 14 12  ALKPHOS 76 59  BILITOT 0.5 0.8  PROT 7.3 6.4*  ALBUMIN 4.2 3.7   No results for input(s): LIPASE, AMYLASE in the last 168 hours. No results for input(s): AMMONIA in the last 168 hours. CBC: Recent Labs  Lab 03/29/20 1805 03/31/20 0519  WBC 13.8* 10.6*  NEUTROABS 9.8* 7.1  HGB 16.9 15.3  HCT 52.1* 49.1  MCV 88.6 91.3  PLT 217 210   Cardiac Enzymes: No results for input(s): CKTOTAL, CKMB, CKMBINDEX, TROPONINI in the last 168 hours. BNP: Invalid input(s): POCBNP CBG: No results for input(s): GLUCAP in the last 168 hours. D-Dimer No results for input(s): DDIMER in the last 72 hours. Hgb A1c Recent Labs    03/30/20 0828  HGBA1C 5.4   Lipid Profile No results for input(s): CHOL, HDL, LDLCALC, TRIG, CHOLHDL, LDLDIRECT in the last 72 hours. Thyroid function studies Recent Labs    03/30/20 0828  TSH 6.874*   Anemia work up No results for input(s): VITAMINB12, FOLATE, FERRITIN, TIBC, IRON, RETICCTPCT in the last 72 hours. Urinalysis    Component Value Date/Time   COLORURINE YELLOW 08/24/2018 1730   APPEARANCEUR CLEAR 08/24/2018 1730   LABSPEC 1.024 08/24/2018 1730   PHURINE 5.0 08/24/2018 1730   GLUCOSEU NEGATIVE 08/24/2018 1730   HGBUR  SMALL (A) 08/24/2018 1730   BILIRUBINUR NEGATIVE 08/24/2018 1730   KETONESUR 5 (A) 08/24/2018 1730   PROTEINUR NEGATIVE 08/24/2018 1730   UROBILINOGEN 1.0 02/10/2013 1004   NITRITE NEGATIVE 08/24/2018 1730   LEUKOCYTESUR NEGATIVE 08/24/2018 1730   Sepsis Labs Invalid input(s): PROCALCITONIN,  WBC,  LACTICIDVEN Microbiology Recent Results (from the past 240 hour(s))  Blood culture (  routine x 2)     Status: None (Preliminary result)   Collection Time: 03/29/20  6:19 PM   Specimen: BLOOD RIGHT ARM  Result Value Ref Range Status   Specimen Description   Final    BLOOD RIGHT ARM Performed at [facility], [address] [place], [place] [address]    Special Requests   Final    BOTTLES DRAWN AEROBIC AND ANAEROBIC Blood Culture adequate volume Performed at [facility], [address] [place], [place] [address]    Culture   Final    NO GROWTH < 24 HOURS Performed at [facility], [address], [place], [place] [address]    Report Status PENDING  Incomplete  Blood culture (routine x 2)     Status: None (Preliminary result)   Collection Time: 03/29/20  6:24 PM   Specimen: Left Antecubital; Blood  Result Value Ref Range Status   Specimen Description   Final    LEFT ANTECUBITAL BLOOD Performed at [facility], [address], [place], [place] [address]    Special Requests   Final    BOTTLES DRAWN AEROBIC AND ANAEROBIC Blood Culture adequate volume Performed at [facility], [address] [place], [place] [address]    Culture   Final    NO GROWTH < 24 HOURS Performed at [facility], [address] [place], [place] [address]    Report Status PENDING  Incomplete  SARS CORONAVIRUS 2 (TAT 6-24 HRS) Nasopharyngeal Nasopharyngeal Swab     Status: None   Collection Time: 03/30/20  2:44 AM   Specimen: Nasopharyngeal Swab  Result Value Ref Range Status   SARS Coronavirus 2 NEGATIVE NEGATIVE Final    Comment: (NOTE) SARS-CoV-2  target nucleic acids are NOT DETECTED.  The SARS-CoV-2 RNA is generally detectable in upper and lower respiratory specimens during the acute phase of infection. Negative results do not preclude SARS-CoV-2 infection, do not rule out co-infections with other pathogens, and should not be used as the sole basis for treatment or other patient management decisions. Negative results must be combined with clinical observations, patient history, and epidemiological information. The expected result is Negative.  Fact Sheet for Patients: HairSlick.no  Fact Sheet for Healthcare Providers: quierodirigir.com  This test is not yet approved or cleared by the [place] FDA and  has been authorized for detection and/or diagnosis of SARS-CoV-2 by FDA under an Emergency Use Authorization (EUA). This EUA will remain  in effect (meaning this test can be used) for the duration of the COVID-19 declaration under Se ction 564(b)(1) of the Act, 21 U.S.C. section 360bbb-3(b)(1), unless the authorization is terminated or revoked sooner.  Performed at [facility], [address], [place], [place] [address]      Time coordinating discharge: 25 minutes The [place] controlled substances registry was reviewed for this patient    SIGNED:   Alberteen Sam, MD  [facility] 03/31/2020, 9:50 AM

## 2020-03-31 NOTE — Progress Notes (Signed)
Patient was given discharge instructions, and all questions were answered.  Patient was stable for discharge and was taken to the main exit by wheelchair. 

## 2020-04-01 ENCOUNTER — Telehealth: Payer: Self-pay | Admitting: Family Medicine

## 2020-04-01 NOTE — Telephone Encounter (Signed)
Received call by patient that he is having itching.  Evidently, he began having itching in the hospital, but did not mention to Drs. Gerkin, Mujtaba or myself.  No tongue swelling, no dyspnea, no fever, no hives or rash.  No previous experience with penicillins.  He feels there was a clear correation between itching and Unasyn administration in the hospital.  Stop Augmentin.  He will continue doxycycline; he has had clinical improvement already, and this should provide good tail treatment of draining cyst disease.    If he has fever, rash, progressive itching, new hives, new lip/tongue swelling, or other new symptoms, he will present immediately to be evaluated in person.  He has an upcoming appointment with Surgery on Tuesday.

## 2020-04-03 LAB — CULTURE, BLOOD (ROUTINE X 2)
Culture: NO GROWTH
Culture: NO GROWTH
Special Requests: ADEQUATE
Special Requests: ADEQUATE

## 2020-04-17 ENCOUNTER — Other Ambulatory Visit: Payer: Self-pay | Admitting: Podiatry

## 2020-04-17 NOTE — Telephone Encounter (Signed)
Please Advise

## 2020-05-05 ENCOUNTER — Other Ambulatory Visit: Payer: Self-pay

## 2020-05-05 ENCOUNTER — Encounter: Payer: Self-pay | Admitting: Plastic Surgery

## 2020-05-05 ENCOUNTER — Ambulatory Visit (INDEPENDENT_AMBULATORY_CARE_PROVIDER_SITE_OTHER): Payer: 59 | Admitting: Plastic Surgery

## 2020-05-05 VITALS — BP 141/94 | HR 106 | Ht 68.0 in | Wt 213.8 lb

## 2020-05-05 DIAGNOSIS — L0501 Pilonidal cyst with abscess: Secondary | ICD-10-CM | POA: Diagnosis not present

## 2020-05-05 NOTE — Progress Notes (Signed)
Patient ID: Anthony Farley, male    DOB: [birth year], 45 y.o.   MRN: [id number]   Chief Complaint  Patient presents with  . Advice Only  . Skin Problem    The patient is a 45 year old male here for evaluation of his sacral area.  The patient has been suffering with a pilonidal cyst for approximately 20 years.  He has had operations in the past.  It seems to not be resolving.  It often involves his groin area to which may be a sign of hidradenitis.  His mom states that she had hidradenitis many years ago and it is better after surgery with excision.  He has been on a combination of doxycycline and Cipro and Augmentin.  The Cipro seemed to help resolve it but it comes back as soon as he is done with the antibiotic.  He had a CT scan in January 2022 and it showed infiltration and skin thickening along the gluteal crease bilaterally with stranding into the adjacent fat.  Incidentally showed a enlarged prostate as well.  There was also concern for perirectal abscess.  He was admitted in January and had open wounds.  These resolved with the antibiotic.  On exam today he is doing really well.  There is no sign of infection.  I do not see any drainage although the patient says that he still getting it.  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  Its been very difficult for him to work because any close seem to irritate his skin.  He is using a combination of creams and surgical soap to keep the area clean.  [facility] but is unable to get 1 before February 2023.  He is worried that this is going to break out again and get worse.   Review of Systems  Constitutional: Positive for activity change. Negative for appetite change.  HENT: Negative.   Eyes: Negative.   Respiratory: Negative for chest tightness and shortness of breath.   Cardiovascular: Negative for leg swelling.  Gastrointestinal: Negative for abdominal distention and abdominal  pain.  Endocrine: Negative.   Genitourinary: Negative.   Musculoskeletal: Negative.   Skin: Positive for color change and wound.  Hematological: Negative.   Psychiatric/Behavioral: Negative.     Past Medical History:  Diagnosis Date  . BPH (benign prostatic hyperplasia)   . BPH (benign prostatic hyperplasia)   . Family history of sudden cardiac death in father   . GERD (gastroesophageal reflux disease)   . Hiatal hernia   . History of kidney stones   . Sebaceous cyst    perineum    Past Surgical History:  Procedure Laterality Date  . COLONOSCOPY WITH ESOPHAGOGASTRODUODENOSCOPY (EGD)  06/13/2016  . EXCISION OF KELOID N/A 06/19/2019   Procedure: EXCISION OF PERIANAL CYST, COMPLEX CLOSURE OF 8 CM WOUND;  Surgeon: Kinsinger, De Blanch, MD;  Location: [facility] ;  Service: General;  Laterality: N/A;  . MASS EXCISION  11/18/2011   Procedure: MINOR EXCISION OF MASS;  Surgeon: Ernestene Mention, MD;  Location: [facility];  Service: General;  Laterality: N/A;  excise scrotal mass  . PILONIDAL CYST EXCISION  x3  last one on 04-30-2004  @[email or web address]       Current Outpatient Medications:  .  docusate sodium (COLACE) 100 MG capsule, Take 1 capsule (100 mg total) by mouth every 12 (twelve) hours., Disp: 30  capsule, Rfl: 0 .  doxycycline (VIBRA-TABS) 100 MG tablet, Take 1 tablet (100 mg total) by mouth every 12 (twelve) hours., Disp: 12 tablet, Rfl: 0 .  hydrocortisone (ANUSOL-HC) 2.5 % rectal cream, Place 1 application rectally 4 (four) times daily as needed for hemorrhoids or anal itching., Disp: , Rfl:  .  hydrocortisone (ANUSOL-HC) 25 MG suppository, Place 25 mg rectally 2 (two) times daily., Disp: , Rfl:  .  lidocaine (XYLOCAINE) 5 % ointment, Apply 1 application topically 4 (four) times daily as needed. (Patient taking differently: Apply 1 application topically 4 (four) times daily as needed for mild pain.), Disp: 50 g, Rfl: 0 .  meloxicam (MOBIC) 15 MG tablet, Take 1  tablet (15 mg total) by mouth daily., Disp: 30 tablet, Rfl: 1 .  neomycin-bacitracin-polymyxin (NEOSPORIN) ointment, Apply 1 application topically as needed for wound care., Disp: , Rfl:  .  RABEprazole (ACIPHEX) 20 MG tablet, Take 20 mg by mouth daily., Disp: , Rfl:  .  tamsulosin (FLOMAX) 0.4 MG CAPS capsule, Take 0.8 mg by mouth daily., Disp: , Rfl:  .  clotrimazole-betamethasone (LOTRISONE) cream, Apply to affected area 2 times daily (Patient not taking: Reported on 05/05/2020), Disp: 15 g, Rfl: 0   Objective:   Vitals:   05/05/20 0954  BP: (!) 141/94  Pulse: (!) 106  SpO2: 98%    Physical Exam Vitals and nursing note reviewed.  Constitutional:      Appearance: Normal appearance.  HENT:     Head: Normocephalic and atraumatic.  Cardiovascular:     Rate and Rhythm: Normal rate.     Pulses: Normal pulses.  Pulmonary:     Effort: Pulmonary effort is normal. No respiratory distress.     Breath sounds: No wheezing.  Abdominal:     General: Abdomen is flat. There is no distension.     Tenderness: There is no abdominal tenderness.  Skin:    General: Skin is warm.     Capillary Refill: Capillary refill takes less than 2 seconds.  Neurological:     General: No focal deficit present.     Mental Status: He is alert and oriented to person, place, and time.  Psychiatric:        Mood and Affect: Mood normal.        Behavior: Behavior normal.        Thought Content: Thought content normal.     Assessment & Plan:  Pilonidal cyst with abscess  Recommend increasing his protein and decreasing his carbohydrates and sugar so that he has a hope that healing the area.  Of course no smoking.  I do not believe he has but I just mentioned that.  Continue with keeping the area clean.  I will reach out to [facility] and see if there is any hope of an earlier appointment.  No picture taken because there was no wound to be seen.  Alena Bills Dillingham, DO

## 2020-05-09 NOTE — Progress Notes (Signed)
05/11/20- 44 yoM current Smoker for sleep evaluation courtesy of Dr Cherly Beach for concern of snoring. Medical problem list includes GERD, Pilonidal Cyst, BPH, Tobacco abuse, Obesity,  Epworth score-6 Body weight today-216 lbs Covid vax-3 Phizer Flu vax-no Lives alone but girl friend visits and tells him of snoring. He feels throat close sometimes. Some awareness of daytime tiredness. No sleep meds, 1 cup coffee in AM. Works [employer] and [employer]. No longer rotating shift, but works long days and sometimes takes calls at night. HS between 8-11PM, up 4:15 AM. Denies ENT surgery, heat or lung problems. Currently out on disability because of a skin infection.(infected pilonidal cyst).  Prior to Admission medications   Medication Sig Start Date End Date Taking? Authorizing Provider  clindamycin (CLINDAGEL) 1 % gel Use one time a day 04/09/20  Yes [provider]  RABEprazole (ACIPHEX) 20 MG tablet Take 20 mg by mouth daily.   Yes [provider]  tamsulosin (FLOMAX) 0.4 MG CAPS capsule Take 0.8 mg by mouth daily.   Yes [provider]  clotrimazole-betamethasone (LOTRISONE) cream Apply to affected area 2 times daily Patient not taking: No sig reported 03/31/20 03/31/21  Danford, Earl Lites, MD  docusate sodium (COLACE) 100 MG capsule Take 1 capsule (100 mg total) by mouth every 12 (twelve) hours. Patient not taking: Reported on 05/11/2020 03/24/20   Arthor Captain, PA-C  doxycycline (VIBRA-TABS) 100 MG tablet Take 1 tablet (100 mg total) by mouth every 12 (twelve) hours. Patient not taking: Reported on 05/11/2020 03/31/20   Alberteen Sam, MD  hydrocortisone (ANUSOL-HC) 2.5 % rectal cream Place 1 application rectally 4 (four) times daily as needed for hemorrhoids or anal itching. Patient not taking: Reported on 05/11/2020    [provider]  hydrocortisone (ANUSOL-HC) 25 MG suppository Place 25 mg rectally 2 (two) times daily. Patient not taking: Reported on  05/11/2020 03/25/20   [provider]  lidocaine (XYLOCAINE) 5 % ointment Apply 1 application topically 4 (four) times daily as needed. Patient not taking: Reported on 05/11/2020 03/24/20   Arthor Captain, PA-C  meloxicam (MOBIC) 15 MG tablet Take 1 tablet (15 mg total) by mouth daily. Patient not taking: Reported on 05/11/2020 02/07/20   Felecia Shelling, DPM  neomycin-bacitracin-polymyxin (NEOSPORIN) ointment Apply 1 application topically as needed for wound care. Patient not taking: Reported on 05/11/2020    [provider]   Past Medical History:  Diagnosis Date  . BPH (benign prostatic hyperplasia)   . BPH (benign prostatic hyperplasia)   . Family history of sudden cardiac death in father   . GERD (gastroesophageal reflux disease)   . Hiatal hernia   . History of kidney stones   . Sebaceous cyst    perineum   Past Surgical History:  Procedure Laterality Date  . COLONOSCOPY WITH ESOPHAGOGASTRODUODENOSCOPY (EGD)  06/13/2016  . EXCISION OF KELOID N/A 06/19/2019   Procedure: EXCISION OF PERIANAL CYST, COMPLEX CLOSURE OF 8 CM WOUND;  Surgeon: Kinsinger, De Blanch, MD;  Location: [facility] [place];  Service: General;  Laterality: N/A;  . MASS EXCISION  11/18/2011   Procedure: MINOR EXCISION OF MASS;  Surgeon: Ernestene Mention, MD;  Location: [facility];  Service: General;  Laterality: N/A;  excise scrotal mass  . PILONIDAL CYST EXCISION  x3  last one on 04-30-2004  @[facility]    Family History  Problem Relation Age of Onset  . Heart disease Father   . Diabetes Paternal Grandmother    Social History  Socioeconomic History  . Marital status: Divorced    Spouse name: Not on file  . Number of children: Not on file  . Years of education: Not on file  . Highest education level: Not on file  Occupational History  . Not on file  Tobacco Use  . Smoking status: Current Every Day Smoker    Packs/day: 1.00    Years: 27.00    Pack years: 27.00    Types:  Cigarettes  . Smokeless tobacco: Never Used  Vaping Use  . Vaping Use: Never used  Substance and Sexual Activity  . Alcohol use: No  . Drug use: Never  . Sexual activity: Yes  Other Topics Concern  . Not on file  Social History Narrative  . Not on file   Social Determinants of Health   Financial Resource Strain: Not on file  Food Insecurity: Not on file  Transportation Needs: Not on file  Physical Activity: Not on file  Stress: Not on file  Social Connections: Not on file  Intimate Partner Violence: Not on file   ROS-see HPI   + = positive Constitutional:    weight loss, night sweats, fevers, chills, +fatigue, lassitude. HEENT:    headaches, difficulty swallowing, tooth/dental problems, sore throat,       sneezing, itching, ear ache, nasal congestion, post nasal drip, snoring CV:    chest pain, orthopnea, PND, swelling in lower extremities, anasarca,                                   dizziness, palpitations Resp:   shortness of breath with exertion or at rest.                productive cough,   non-productive cough, coughing up of blood.              change in color of mucus.  wheezing.   Skin:    rash or lesions. GI:  No-   heartburn, indigestion, abdominal pain, nausea, vomiting, diarrhea,                 change in bowel habits, loss of appetite GU: dysuria, change in color of urine, no urgency or frequency.   flank pain. MS:   joint pain, stiffness, decreased range of motion, back pain. Neuro-     nothing unusual Psych:  change in mood or affect.  depression or anxiety.   memory loss.  OBJ- Physical Exam General- Alert, Oriented, Affect-appropriate, Distress- none acute Skin- rash-none, lesions- none, excoriation- none Lymphadenopathy- none Head- atraumatic            Eyes- Gross vision intact, PERRLA, conjunctivae and secretions clear            Ears- Hearing, canals-normal            Nose- Clear, no-Septal dev, mucus, polyps, erosion, perforation              Throat- Mallampati III-IV long thin palateI , mucosa clear , drainage- none, tonsils- atrophic, + teeth, Neck- flexible , trachea midline, no stridor , thyroid nl, carotid no bruit Chest - symmetrical excursion , unlabored           Heart/CV- RRR , no murmur , no gallop  , no rub, nl s1 s2                           -  JVD- none , edema- none, stasis changes- none, varices- none           Lung- clear to P&A, wheeze- none, cough- none , dullness-none, rub- none           Chest wall-  Abd-  Br/ Gen/ Rectal- Not done, not indicated Extrem- cyanosis- none, clubbing, none, atrophy- none, strength- nl Neuro- grossly intact to observation

## 2020-05-11 ENCOUNTER — Ambulatory Visit (INDEPENDENT_AMBULATORY_CARE_PROVIDER_SITE_OTHER): Payer: 59 | Admitting: Internal Medicine

## 2020-05-11 ENCOUNTER — Encounter: Payer: Self-pay | Admitting: Internal Medicine

## 2020-05-11 ENCOUNTER — Other Ambulatory Visit: Payer: Self-pay

## 2020-05-11 VITALS — BP 128/68 | HR 89 | Temp 97.7°F | Ht 68.0 in | Wt 216.4 lb

## 2020-05-11 DIAGNOSIS — G4733 Obstructive sleep apnea (adult) (pediatric): Secondary | ICD-10-CM | POA: Diagnosis not present

## 2020-05-11 DIAGNOSIS — R0683 Snoring: Secondary | ICD-10-CM | POA: Diagnosis not present

## 2020-05-11 DIAGNOSIS — Z8249 Family history of ischemic heart disease and other diseases of the circulatory system: Secondary | ICD-10-CM | POA: Diagnosis not present

## 2020-05-11 NOTE — Assessment & Plan Note (Signed)
Father died MI age 47, remembered as a snorer- discussed.

## 2020-05-11 NOTE — Assessment & Plan Note (Signed)
Suspect OSA,. Complicating history of past work rotating shifts and still prone to be called at night about work. Discussed medical concerns, res[ponsibility to drive safely, testing, treatment options Plan- home sleep test then likely CPAP if appropriate. Some potential for ENT to address long palate, future consideration of [facility].

## 2020-05-11 NOTE — Patient Instructions (Signed)
Order - schedule home sleep test dx OSA  Please call us about 2 weeks after your sleep test for results and recommendations If appropriate, we may be able to start treatment before we see you next.

## 2020-05-27 ENCOUNTER — Other Ambulatory Visit: Payer: Self-pay

## 2020-05-27 ENCOUNTER — Ambulatory Visit: Payer: 59

## 2020-05-27 DIAGNOSIS — G4733 Obstructive sleep apnea (adult) (pediatric): Secondary | ICD-10-CM | POA: Diagnosis not present

## 2020-05-29 DIAGNOSIS — G4733 Obstructive sleep apnea (adult) (pediatric): Secondary | ICD-10-CM

## 2020-06-15 ENCOUNTER — Telehealth: Payer: Self-pay | Admitting: Internal Medicine

## 2020-06-15 DIAGNOSIS — G4733 Obstructive sleep apnea (adult) (pediatric): Secondary | ICD-10-CM

## 2020-06-15 NOTE — Telephone Encounter (Signed)
CY pt is calling to get the results of his HST that was done on 05/27/20.  Please advise. Thanks

## 2020-06-15 NOTE — Telephone Encounter (Signed)
Called and went over Sleep study results per Dr Maple Hudson with patient. All questions answered and patient agreeable to Dr Roxy Cedar recommendation for CPAP. Order placed per Dr Maple Hudson. Patient expressed full understanding of results, orders and to call and scheduled office visit with Dr Maple Hudson 31-90 days after he get's CPAP. Nothing further needed at this time.

## 2020-06-15 NOTE — Telephone Encounter (Signed)
Sleep study showed moderate sleep apnea, averaging 20 apneas/ hour, with drops in blood oxygen level  I recommend we order new DME, new CPAP auto 5-20, mask of choice, humidifier, supplies, AirBView/ card. Ok to use [employer] brand if available to minimize delay.   He will need return of in 31- 90 days after he gets machine

## 2020-07-21 ENCOUNTER — Emergency Department (HOSPITAL_COMMUNITY)
Admission: EM | Admit: 2020-07-21 | Discharge: 2020-07-21 | Disposition: A | Discharge status: Home / Self Care | Payer: 59 | Attending: Emergency Medicine | Admitting: Emergency Medicine

## 2020-07-21 ENCOUNTER — Encounter (HOSPITAL_COMMUNITY): Payer: Self-pay

## 2020-07-21 DIAGNOSIS — F17210 Nicotine dependence, cigarettes, uncomplicated: Secondary | ICD-10-CM | POA: Diagnosis not present

## 2020-07-21 DIAGNOSIS — N410 Acute prostatitis: Secondary | ICD-10-CM | POA: Insufficient documentation

## 2020-07-21 DIAGNOSIS — R339 Retention of urine, unspecified: Secondary | ICD-10-CM | POA: Diagnosis present

## 2020-07-21 LAB — CBC WITH DIFFERENTIAL/PLATELET
Abs Immature Granulocytes: 0.07 10*3/uL (ref 0.00–0.07)
Basophils Absolute: 0.1 10*3/uL (ref 0.0–0.1)
Basophils Relative: 1 %
Eosinophils Absolute: 0.2 10*3/uL (ref 0.0–0.5)
Eosinophils Relative: 2 %
HCT: 47.7 % (ref 39.0–52.0)
Hemoglobin: 15.3 g/dL (ref 13.0–17.0)
Immature Granulocytes: 1 %
Lymphocytes Relative: 18 %
Lymphs Abs: 1.7 10*3/uL (ref 0.7–4.0)
MCH: 28.3 pg (ref 26.0–34.0)
MCHC: 32.1 g/dL (ref 30.0–36.0)
MCV: 88.2 fL (ref 80.0–100.0)
Monocytes Absolute: 1.3 10*3/uL — ABNORMAL HIGH (ref 0.1–1.0)
Monocytes Relative: 14 %
Neutro Abs: 6 10*3/uL (ref 1.7–7.7)
Neutrophils Relative %: 64 %
Platelets: 263 10*3/uL (ref 150–400)
RBC: 5.41 MIL/uL (ref 4.22–5.81)
RDW: 13 % (ref 11.5–15.5)
WBC: 9.2 10*3/uL (ref 4.0–10.5)
nRBC: 0 % (ref 0.0–0.2)

## 2020-07-21 LAB — URINALYSIS, ROUTINE W REFLEX MICROSCOPIC
Bacteria, UA: NONE SEEN
Bilirubin Urine: NEGATIVE
Glucose, UA: NEGATIVE mg/dL
Ketones, ur: 5 mg/dL — AB
Nitrite: NEGATIVE
Protein, ur: 100 mg/dL — AB
Specific Gravity, Urine: 1.035 — ABNORMAL HIGH (ref 1.005–1.030)
WBC, UA: >50 WBC/hpf — ABNORMAL HIGH (ref 0–5)
pH: 5 (ref 5.0–8.0)

## 2020-07-21 LAB — BASIC METABOLIC PANEL
Anion gap: 10 (ref 5–15)
BUN: 25 mg/dL — ABNORMAL HIGH (ref 6–20)
CO2: 24 mmol/L (ref 22–32)
Calcium: 9.6 mg/dL (ref 8.9–10.3)
Chloride: 105 mmol/L (ref 98–111)
Creatinine, Ser: 1.01 mg/dL (ref 0.61–1.24)
GFR, Estimated: >60 mL/min (ref >60)
Glucose, Bld: 109 mg/dL — ABNORMAL HIGH (ref 70–99)
Potassium: 4.1 mmol/L (ref 3.5–5.1)
Sodium: 139 mmol/L (ref 135–145)

## 2020-07-21 MED ORDER — MELOXICAM 15 MG PO TABS: 15.0000 mg | ORAL_TABLET | Freq: Every day | ORAL | 0 refills | Status: AC

## 2020-07-21 MED ORDER — CIPROFLOXACIN HCL 500 MG PO TABS: 500.0000 mg | ORAL_TABLET | Freq: Two times a day (BID) | ORAL | 0 refills | Status: AC

## 2020-07-21 NOTE — ED Provider Notes (Signed)
[facility]-EMERGENCY DEPT Provider Note   CSN: [id number] Arrival date & time: 07/21/20  0153     History Chief Complaint  Patient presents with  . Urinary Retention    Anthony Farley is a 45 y.o. male.  HPI Patient is a 45 year old male with past medical history significant for BPH.  He states that he has had difficulty urinating and fullness sensation in his rectum since Thursday.  He states fevers and chills although he states his T-max has been 99/100.  He denies any nausea vomiting chest pain shortness of breath.  No significant abdominal pain.  No lightheadedness or dizziness.  He was seen in urgent care either Thursday or Friday diagnosed with prostatitis and discharged with 2 weeks of ciprofloxacin.  He comes to the ER today primarily because of discomfort in his rectum continued tightness in that area as well as some difficulty urinating.  He has not been taking any Tylenol, ibuprofen or any other medications other than his ciprofloxacin.  He states he has been taking as prescribed.  Denies any concern for STDs no penile discharge.  Denies any abdominal or flank pain.  No history of kidney stones personally states that he has family members to his abdomen.  No aggravating or mitigating factors.  His symptoms have been consistent and unchanged.    Past Medical History:  Diagnosis Date  . BPH (benign prostatic hyperplasia)   . BPH (benign prostatic hyperplasia)   . Family history of sudden cardiac death in father   . GERD (gastroesophageal reflux disease)   . Hiatal hernia   . History of kidney stones   . Sebaceous cyst    perineum    Patient Active Problem List   Diagnosis Date Noted  . Snoring 05/11/2020  . Cellulitis of buttock 03/30/2020  . Cellulitis, perineum 03/30/2020  . Benign prostatic hyperplasia without lower urinary tract symptoms 09/20/2017  . Obesity (BMI 30.0-34.9) 09/20/2017  . Cigarette nicotine dependence without complication  04/18/2016  . Family history of heart disease in male family member before age 60 04/18/2016  . Gastroesophageal reflux disease with esophagitis without hemorrhage 04/18/2016  . Pilonidal cyst with abscess 02/07/2012    Past Surgical History:  Procedure Laterality Date  . COLONOSCOPY WITH ESOPHAGOGASTRODUODENOSCOPY (EGD)  06/13/2016  . EXCISION OF KELOID N/A 06/19/2019   Procedure: EXCISION OF PERIANAL CYST, COMPLEX CLOSURE OF 8 CM WOUND;  Surgeon: Kinsinger, De Blanch, MD;  Location: [facility];  Service: General;  Laterality: N/A;  . MASS EXCISION  11/18/2011   Procedure: MINOR EXCISION OF MASS;  Surgeon: Ernestene Mention, MD;  Location: [facility];  Service: General;  Laterality: N/A;  excise scrotal mass  . PILONIDAL CYST EXCISION  x3  last one on 04-30-2004  @[facility]        Family History  Problem Relation Age of Onset  . Heart disease Father   . Diabetes Paternal Grandmother     Social History   Tobacco Use  . Smoking status: Current Every Day Smoker    Packs/day: 1.00    Years: 27.00    Pack years: 27.00    Types: Cigarettes  . Smokeless tobacco: Never Used  Vaping Use  . Vaping Use: Never used  Substance Use Topics  . Alcohol use: No  . Drug use: Never    Home Medications Prior to Admission medications   Medication Sig Start Date End Date Taking? Authorizing Provider  ciprofloxacin (CIPRO) 500 MG tablet Take 1  tablet (500 mg total) by mouth every 12 (twelve) hours for 14 days. 07/21/20 08/04/20 Yes Ajeenah Heiny, Rodrigo Ran, PA  clindamycin (CLINDAGEL) 1 % gel Use one time a day 04/09/20   [provider]  hydrocortisone (ANUSOL-HC) 2.5 % rectal cream Place 1 application rectally 4 (four) times daily as needed for hemorrhoids or anal itching. Patient not taking: Reported on 05/11/2020    [provider]  lidocaine (XYLOCAINE) 5 % ointment Apply 1 application topically 4 (four) times daily as needed. Patient not taking: Reported on  05/11/2020 03/24/20   Arthor Captain, PA-C  meloxicam (MOBIC) 15 MG tablet Take 1 tablet (15 mg total) by mouth daily for 15 days. 07/21/20 08/05/20  Gailen Shelter, PA  neomycin-bacitracin-polymyxin (NEOSPORIN) ointment Apply 1 application topically as needed for wound care. Patient not taking: Reported on 05/11/2020    [provider]  RABEprazole (ACIPHEX) 20 MG tablet Take 20 mg by mouth daily.    [provider]  tamsulosin (FLOMAX) 0.4 MG CAPS capsule Take 0.8 mg by mouth daily.    [provider]    Allergies    Augmentin [amoxicillin-pot clavulanate], Amoxicillin, and Doxycycline  Review of Systems   Review of Systems  Constitutional: Negative for chills and fever.  HENT: Negative for congestion.   Respiratory: Negative for shortness of breath.   Cardiovascular: Negative for chest pain.  Gastrointestinal: Negative for abdominal pain.  Genitourinary: Positive for difficulty urinating.  Musculoskeletal: Negative for neck pain.    Physical Exam Updated Vital Signs BP 135/89   Pulse 77   Temp 98.9 F (37.2 C) (Oral)   Resp 18   Ht 5\' 8"  (1.727 m)   Wt 96.6 kg   SpO2 99%   BMI 32.39 kg/m   Physical Exam Vitals and nursing note reviewed.  Constitutional:      General: He is not in acute distress. HENT:     Head: Normocephalic and atraumatic.     Nose: Nose normal.  Eyes:     General: No scleral icterus. Cardiovascular:     Rate and Rhythm: Normal rate and regular rhythm.     Pulses: Normal pulses.     Heart sounds: Normal heart sounds.  Pulmonary:     Effort: Pulmonary effort is normal. No respiratory distress.     Breath sounds: No wheezing.  Abdominal:     Palpations: Abdomen is soft.     Tenderness: There is no abdominal tenderness.     Comments: Abdomen is soft nontender no guarding or rebound  Musculoskeletal:     Cervical back: Normal range of motion.     Right lower leg: No edema.     Left lower leg: No edema.  Skin:     General: Skin is warm and dry.     Capillary Refill: Capillary refill takes less than 2 seconds.  Neurological:     Mental Status: He is alert. Mental status is at baseline.  Psychiatric:        Mood and Affect: Mood normal.        Behavior: Behavior normal.     ED Results / Procedures / Treatments   Labs (all labs ordered are listed, but only abnormal results are displayed) Labs Reviewed  URINALYSIS, ROUTINE W REFLEX MICROSCOPIC - Abnormal; Notable for the following components:      Result Value   APPearance HAZY (*)    Specific Gravity, Urine 1.035 (*)    Hgb urine dipstick MODERATE (*)    Ketones,  ur 5 (*)    Protein, ur 100 (*)    Leukocytes,Ua MODERATE (*)    WBC, UA >50 (*)    All other components within normal limits  BASIC METABOLIC PANEL - Abnormal; Notable for the following components:   Glucose, Bld 109 (*)    BUN 25 (*)    All other components within normal limits  CBC WITH DIFFERENTIAL/PLATELET - Abnormal; Notable for the following components:   Monocytes Absolute 1.3 (*)    All other components within normal limits  URINE CULTURE  GC/CHLAMYDIA PROBE AMP ([place]) NOT AT [facility]    EKG None  Radiology No results found.  Procedures Procedures   Medications Ordered in ED Medications - No data to display  ED Course  I have reviewed the triage vital signs and the nursing notes.  Pertinent labs & imaging results that were available during my care of the patient were reviewed by me and considered in my medical decision making (see chart for details).    MDM Rules/Calculators/A&P                          Patient is a 45 year old male presented today for hesitancy and difficulty urinating he states he has had decreased urine as well.  He states he has been hydrating appropriately.  His symptoms consist primarily of discomfort with urination.  No fever although he states his temperature was 99 which is hot for him.  No nausea vomiting or diarrhea.  No  pain with defecation.  Vital signs within normal limits.  BMP unremarkable with normal kidney function.  CBC without leukocytosis or anemia.  Urinalysis with moderate leukocytes greater than 50 WBC, protein, ketones.   Urine culture and GC chlamydia pending.  Discussed case with attending physician.   Plan is to discharge with 2 more weeks of ciprofloxacin for total of 28 days for complete treatment.  He will follow-up with urology.  Recommended ibuprofen or meloxicam he prefers to use meloxicam.  Patient is agreeable to follow-up with PCP and urology.  All questions answered best my ability.  Final Clinical Impression(s) / ED Diagnoses Final diagnoses:  Acute prostatitis    Rx / DC Orders ED Discharge Orders         Ordered    meloxicam (MOBIC) 15 MG tablet  Daily        07/21/20 0820    ciprofloxacin (CIPRO) 500 MG tablet  Every 12 hours        07/21/20 0833           Gailen Shelter, PA 07/21/20 1224    Sabino Donovan, MD 07/22/20 1343

## 2020-07-21 NOTE — Discharge Instructions (Addendum)
I suspect that your symptoms are due to prostatitis.  You have already been prescribed 2 weeks of ciprofloxacin.  I have prescribed you an additional 2 weeks. This is a total of 28 days of antibiotics.  I also recommend taking probiotics which can be bought over-the-counter.  Anything containing lactobacillus is helpful.  Please use Tylenol or ibuprofen for pain.  You may use 600 mg ibuprofen every 6 hours or 1000 mg of Tylenol every 6 hours.  You may choose to alternate between the 2.  This would be most effective.  Not to exceed 4 g of Tylenol within 24 hours.  Not to exceed 3200 mg ibuprofen 24 hours.   Is important to at least take the ibuprofen.

## 2020-07-21 NOTE — ED Notes (Signed)
Bladder scan post void shows 0 ml. Denies urge or tenderness to palpation

## 2020-07-22 LAB — URINE CULTURE: Culture: NO GROWTH

## 2020-07-22 LAB — GC/CHLAMYDIA PROBE AMP (~~LOC~~) NOT AT ARMC
Chlamydia: NEGATIVE
Comment: NEGATIVE
Comment: NORMAL
Neisseria Gonorrhea: NEGATIVE

## 2020-09-10 ENCOUNTER — Ambulatory Visit: Payer: 59 | Admitting: Internal Medicine

## 2020-11-18 NOTE — Progress Notes (Signed)
05/11/20- 44 yoM current Smoker for sleep evaluation courtesy of Dr Cherly Beach for concern of snoring. Medical problem list includes GERD, Pilonidal Cyst, BPH, Tobacco abuse, Obesity,  Epworth score-6 Body weight today-216 lbs Covid vax-3 Phizer Flu vax-no Lives alone but girl friend visits and tells him of snoring. He feels throat close sometimes. Some awareness of daytime tiredness. No sleep meds, 1 cup coffee in AM. Works [employer] and [employer]. No longer rotating shift, but works long days and sometimes takes calls at night. HS between 8-11PM, up 4:15 AM. Denies ENT surgery, heat or lung problems. Currently out on disability because of a skin infection.(infected pilonidal cyst).  11/19/20- 45 yoM Smoker (27 pk yrs) followed for OSA, complicated by GERD, BPH, Tobacco abuse, Obesity, Pilonidal Cyst repair,  HST - 05/27/20- CPAP auto 5-20/ [facility]     ordered 06/15/20   Not yet received as of this visit date! Download- Body weight today-219 lbs Covid vax- He is still awaiting delivery of CPAP ordered 5 months ago. Denies adverse changes, since last seen. I reviewed supply chain status. We will reorder, including Luna option.         // candidate for smoking cessation pharmacy program?//  ROS-see HPI   + = positive Constitutional:    weight loss, night sweats, fevers, chills, +fatigue, lassitude. HEENT:    headaches, difficulty swallowing, tooth/dental problems, sore throat,       sneezing, itching, ear ache, nasal congestion, post nasal drip, snoring CV:    chest pain, orthopnea, PND, swelling in lower extremities, anasarca,                                   dizziness, palpitations Resp:   shortness of breath with exertion or at rest.                productive cough,   non-productive cough, coughing up of blood.              change in color of mucus.  wheezing.   Skin:    rash or lesions. GI:  No-   heartburn, indigestion, abdominal pain, nausea, vomiting, diarrhea,                 change  in bowel habits, loss of appetite GU: dysuria, change in color of urine, no urgency or frequency.   flank pain. MS:   joint pain, stiffness, decreased range of motion, back pain. Neuro-     nothing unusual Psych:  change in mood or affect.  depression or anxiety.   memory loss.  OBJ- Physical Exam General- Alert, Oriented, Affect-appropriate, Distress- none acute Skin- rash-none, lesions- none, excoriation- none Lymphadenopathy- none Head- atraumatic            Eyes- Gross vision intact, PERRLA, conjunctivae and secretions clear            Ears- Hearing, canals-normal            Nose- Clear, no-Septal dev, mucus, polyps, erosion, perforation             Throat- Mallampati III-IV long thin palateI , mucosa clear , drainage- none, tonsils- atrophic, + teeth, Neck- flexible , trachea midline, no stridor , thyroid nl, carotid no bruit Chest - symmetrical excursion , unlabored           Heart/CV- RRR , no murmur , no gallop  , no rub, nl s1 s2                           -  JVD- none , edema- none, stasis changes- none, varices- none           Lung- clear to P&A, wheeze- none, cough- none , dullness-none, rub- none           Chest wall-  Abd-  Br/ Gen/ Rectal- Not done, not indicated Extrem- cyanosis- none, clubbing, none, atrophy- none, strength- nl Neuro- grossly intact to observation

## 2020-11-19 ENCOUNTER — Encounter: Payer: Self-pay | Admitting: Internal Medicine

## 2020-11-19 ENCOUNTER — Other Ambulatory Visit: Payer: Self-pay

## 2020-11-19 ENCOUNTER — Ambulatory Visit (INDEPENDENT_AMBULATORY_CARE_PROVIDER_SITE_OTHER): Payer: 59 | Admitting: Internal Medicine

## 2020-11-19 VITALS — BP 114/70 | HR 82 | Temp 98.1°F | Ht 68.0 in | Wt 219.2 lb

## 2020-11-19 DIAGNOSIS — G4733 Obstructive sleep apnea (adult) (pediatric): Secondary | ICD-10-CM

## 2020-11-19 DIAGNOSIS — F17210 Nicotine dependence, cigarettes, uncomplicated: Secondary | ICD-10-CM

## 2020-11-19 NOTE — Assessment & Plan Note (Addendum)
Disappointed that he doesn't have CPAP yet.  He will need to call us when he does get it, so we can schedule f/u/.  Advising [facility] ok to use Doy Mince

## 2020-11-19 NOTE — Patient Instructions (Addendum)
Order- DME [facility]- It has been 5 months since original order- New CPAP- OK to Use Luna- Auto 5-20, mask of choice, humidifier, supplies, AirView/ card. Please expedite.  Please call us when you get your machine, so we can set up a return visit in 31-90 days after you get it, as required by insurance.  Please call anytime if we can help.

## 2020-11-19 NOTE — Assessment & Plan Note (Signed)
He may be interested in smoking cessation program next visit

## 2021-01-16 NOTE — Progress Notes (Signed)
HPI 39 yoM Smoker (27 pk yrs) followed for OSA, complicated by GERD, BPH, Tobacco abuse, Obesity, Pilonidal Cyst repair, HST - 05/27/20- AHI 20/ hr, desaturation to 87%, body weight 216 lbs  ================================================================= 11/19/20- 45 yoM Smoker (27 pk yrs) followed for OSA, complicated by GERD, BPH, Tobacco abuse, Obesity, Pilonidal Cyst repair,  HST - 05/27/20- AHI 20/ hr, desaturation to 87%, body weight 216 lbs CPAP auto 5-20/ [facility]     ordered 06/15/20   Not yet received as of this visit date! Download- Body weight today-219 lbs Covid vax- He is still awaiting delivery of CPAP ordered 5 months ago. Denies adverse changes, since last seen. I reviewed supply chain status. We will reorder, including Luna option.   01/19/21- 45 yoM Smoker (27 pk yrs) complicated by GERD, BPH, Tobacco abuse, Obesity, Pilonidal Cyst repair,  CPAP- auto 5-20,/ [facility]      AirSense 11 AutoSet Download-compliance 90%, AHI 0.4/ hr Body weight today-223 lbs Covid vax-3 Phizer Flu vax-declines Has gotten used to CPAP and recognizes he sleeps better. Much more tired in AM if he skips while away from home. Download reviewed and goals discussed  Discussed smoking- he is willing to talk with Pharmacy smoking cessation program.  ROS-see HPI   + = positive Constitutional:    weight loss, night sweats, fevers, chills, +fatigue, lassitude. HEENT:    headaches, difficulty swallowing, tooth/dental problems, sore throat,       sneezing, itching, ear ache, nasal congestion, post nasal drip, snoring CV:    chest pain, orthopnea, PND, swelling in lower extremities, anasarca,                                   dizziness, palpitations Resp:   shortness of breath with exertion or at rest.                productive cough,   non-productive cough, coughing up of blood.              change in color of mucus.  wheezing.   Skin:    rash or lesions. GI:  No-   heartburn, indigestion,  abdominal pain, nausea, vomiting, diarrhea,                 change in bowel habits, loss of appetite GU: dysuria, change in color of urine, no urgency or frequency.   flank pain. MS:   joint pain, stiffness, decreased range of motion, back pain. Neuro-     nothing unusual Psych:  change in mood or affect.  depression or anxiety.   memory loss.  OBJ- Physical Exam General- Alert, Oriented, Affect-appropriate, Distress- none acute Skin- rash-none, lesions- none, excoriation- none Lymphadenopathy- none Head- atraumatic            Eyes- Gross vision intact, PERRLA, conjunctivae and secretions clear            Ears- Hearing, canals-normal            Nose- Clear, no-Septal dev, mucus, polyps, erosion, perforation             Throat- Mallampati III-IV long thin palateI , mucosa clear , drainage- none, tonsils- atrophic, + teeth, Neck- flexible , trachea midline, no stridor , thyroid nl, carotid no bruit Chest - symmetrical excursion , unlabored           Heart/CV- RRR , no murmur , no gallop  , no rub,  nl s1 s2                           - JVD- none , edema- none, stasis changes- none, varices- none           Lung- clear to P&A, wheeze- none, cough- none , dullness-none, rub- none           Chest wall-  Abd-  Br/ Gen/ Rectal- Not done, not indicated Extrem- cyanosis- none, clubbing, none, atrophy- none, strength- nl Neuro- grossly intact to observation

## 2021-01-17 ENCOUNTER — Encounter: Payer: Self-pay | Admitting: Internal Medicine

## 2021-01-19 ENCOUNTER — Ambulatory Visit (INDEPENDENT_AMBULATORY_CARE_PROVIDER_SITE_OTHER): Payer: 59 | Admitting: Internal Medicine

## 2021-01-19 ENCOUNTER — Other Ambulatory Visit: Payer: Self-pay

## 2021-01-19 ENCOUNTER — Encounter: Payer: Self-pay | Admitting: Internal Medicine

## 2021-01-19 ENCOUNTER — Telehealth: Payer: Self-pay

## 2021-01-19 DIAGNOSIS — G4733 Obstructive sleep apnea (adult) (pediatric): Secondary | ICD-10-CM | POA: Diagnosis not present

## 2021-01-19 DIAGNOSIS — F17210 Nicotine dependence, cigarettes, uncomplicated: Secondary | ICD-10-CM | POA: Diagnosis not present

## 2021-01-19 NOTE — Telephone Encounter (Signed)
Please contact patient for Smoking Cessation.   Thanks

## 2021-01-19 NOTE — Assessment & Plan Note (Signed)
Benefits from CPAP with improved sleep. Plan- continue auto 5-20 

## 2021-01-19 NOTE — Patient Instructions (Signed)
We can continue CPAP auto 5-20  Order- send name to Pharmacy team smoking cessation program  Please call if we can help

## 2021-01-19 NOTE — Assessment & Plan Note (Signed)
Discussed. He c/o cost of patches. Plan- pharmacy smoking cessation program

## 2021-02-17 ENCOUNTER — Telehealth: Payer: Self-pay

## 2021-02-17 NOTE — Telephone Encounter (Signed)
Patient was contacted for initial telephone-based smoking cessation screening. However, unable to reach patient and a HIPAA compliant voicemail was left. Will attempt to contact patient two more times until they must be re-referred.   Valeda Malm, Pharm.D. PGY-1 Pharmacy Resident 02/17/2021 4:31 PM

## 2021-03-15 ENCOUNTER — Telehealth: Payer: Self-pay

## 2021-03-15 NOTE — Telephone Encounter (Signed)
Able to reach patient regarding initial screening for smoking cessation telephone based service after second attempt. Patient informed me that he has successfully quit smoking and no longer needs this service. Congratulated patient on success and encouraged patient to contact the pulmonary clinic if he would like to be re-referred.   Valeda Malm, Pharm.D. PGY-1 Pharmacy Resident 03/15/2021 2:28 PM

## 2021-12-13 IMAGING — CT CT PELVIS W/ CM
2 of 3 series · 17 of 46 positions shown, 19 images · IV contrast (omnipaque)
Comparison: 08/24/2018 CT abdomen/pelvis.

CLINICAL DATA: Severe buttock pain and swelling. Reported
thrombosed hemorrhoid. History of pilonidal cyst surgery x5.

EXAM:
CT PELVIS WITH CONTRAST
TECHNIQUE: Multidetector CT imaging of the pelvis was performed using the
standard protocol following the bolus administration of intravenous
contrast.
CONTRAST:  100mL OMNIPAQUE IOHEXOL 300 MG/ML  SOLN

[Series 2: abd pelvis 5.00 · axial · 0.74mm/px · z∈[-1644,-1369]mm · 14 of 65 slices shown, 16 images]
[im 5/65  soft-tissue]
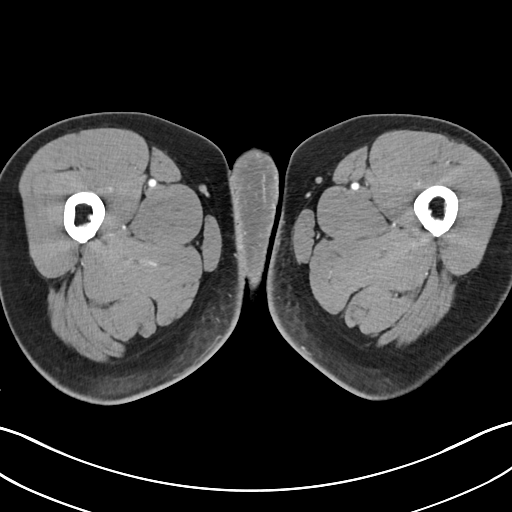
[im 5/65  bone]
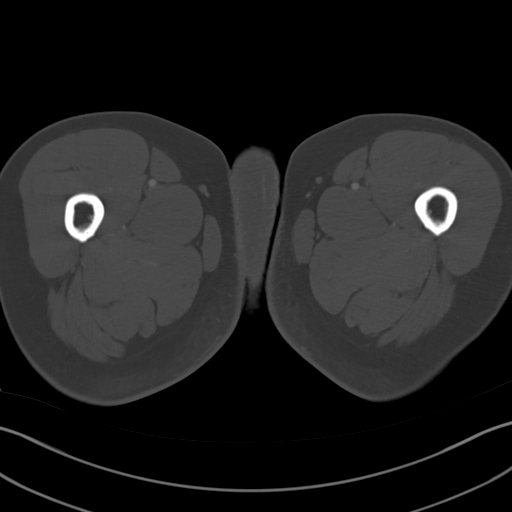
[im 9/65  soft-tissue]
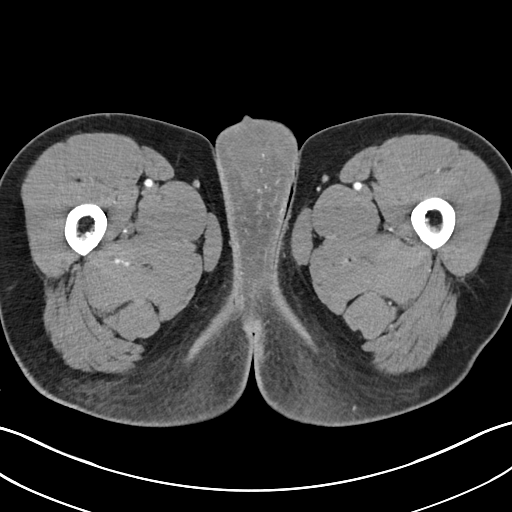
[im 13/65  soft-tissue]
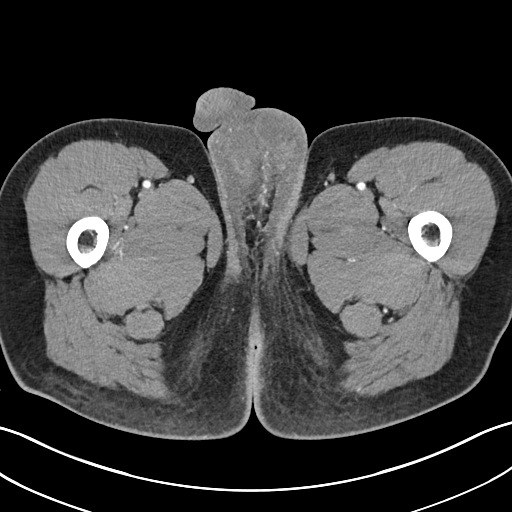
[im 17/65  soft-tissue]
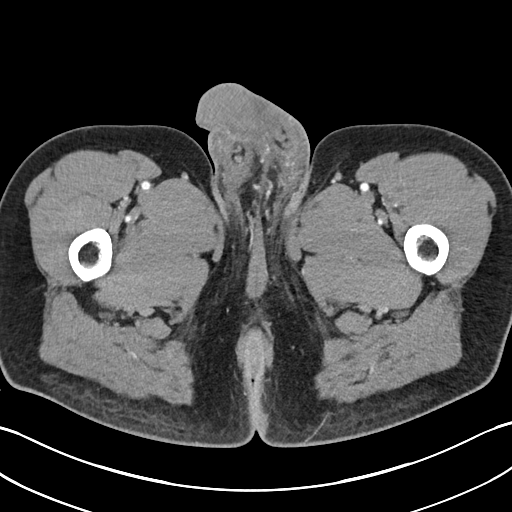
[im 21/65  soft-tissue]
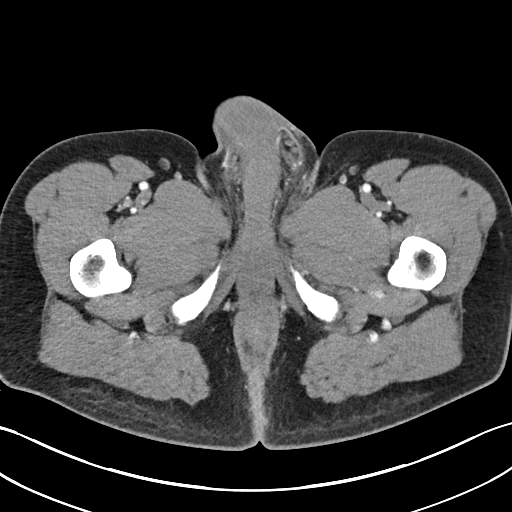
[im 25/65  soft-tissue]
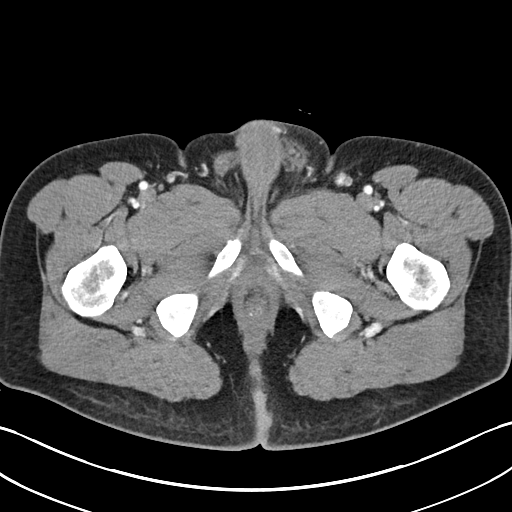
[im 29/65  soft-tissue]
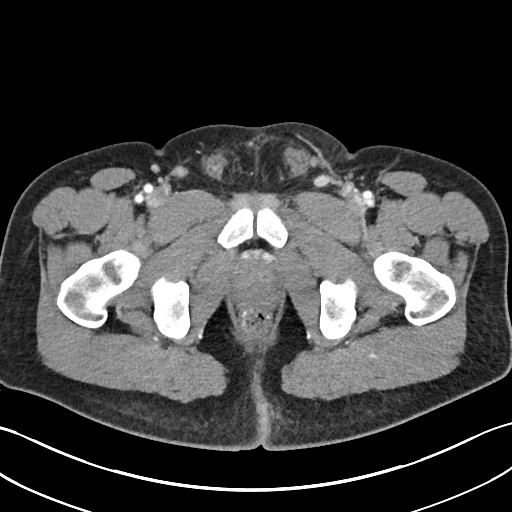
[im 36/65  soft-tissue]
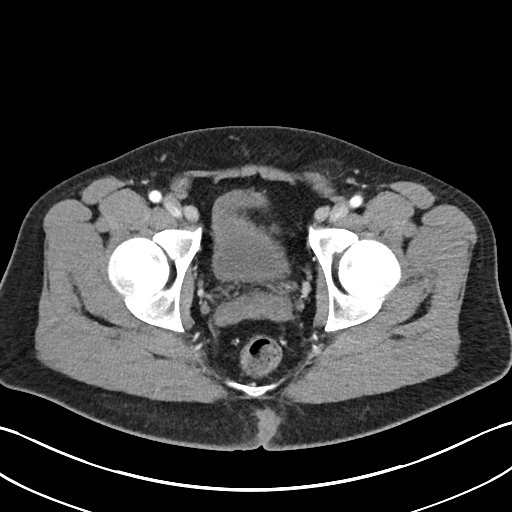
[im 40/65  soft-tissue]
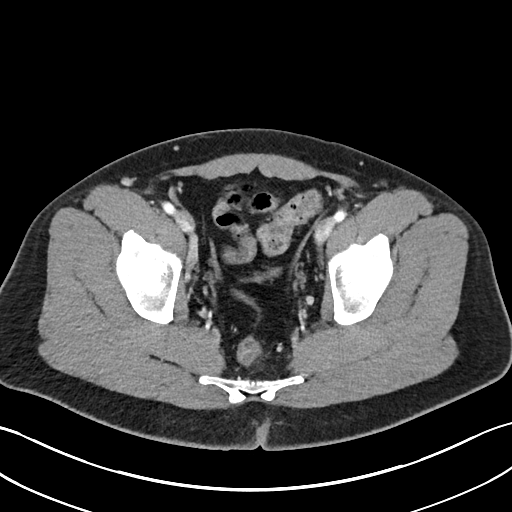
[im 40/65  bone]
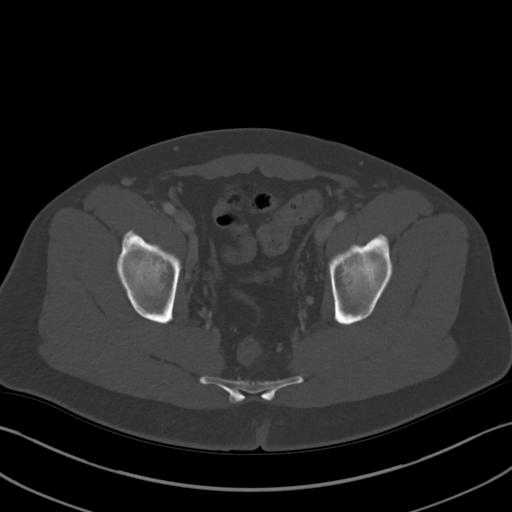
[im 44/65  soft-tissue]
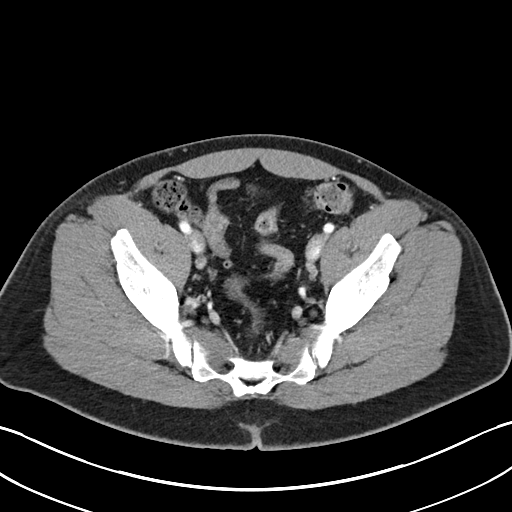
[im 48/65  soft-tissue]
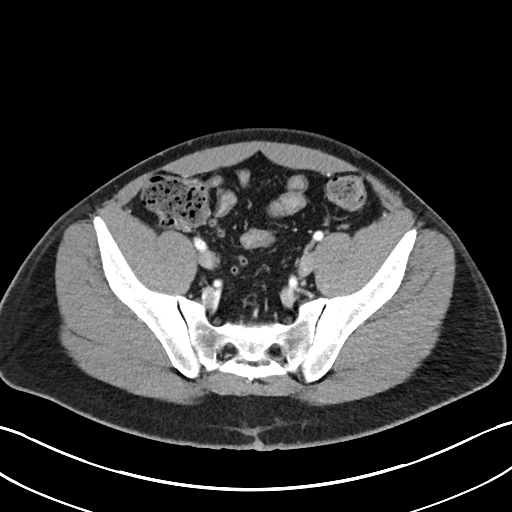
[im 52/65  soft-tissue]
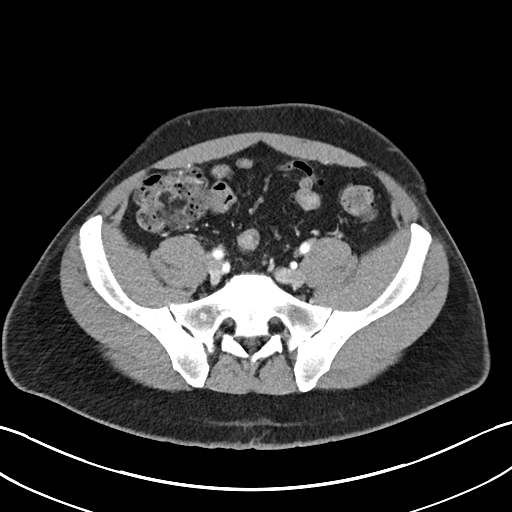
[im 56/65  soft-tissue]
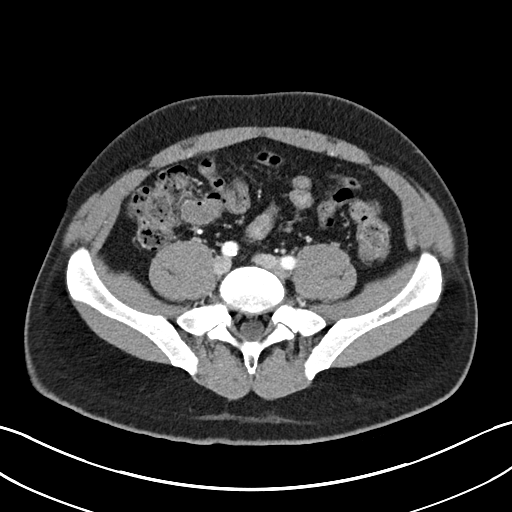
[im 60/65  soft-tissue]
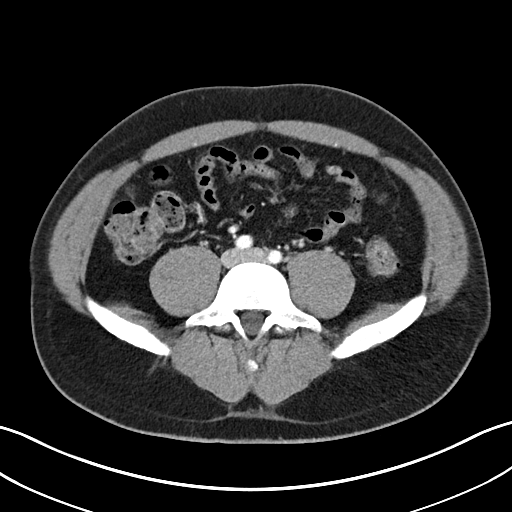

[Series 3: coronals abd pelvis 2.00 cor · coronal · 0.64mm/px · 3 of 146 slices shown]
[im 49/146  soft-tissue]
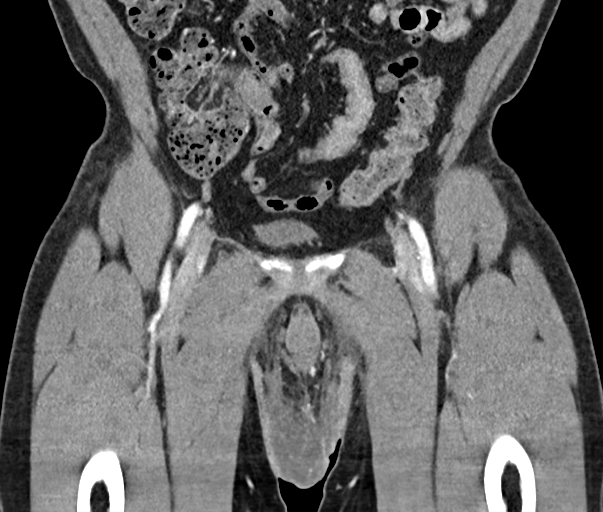
[im 65/146  soft-tissue]
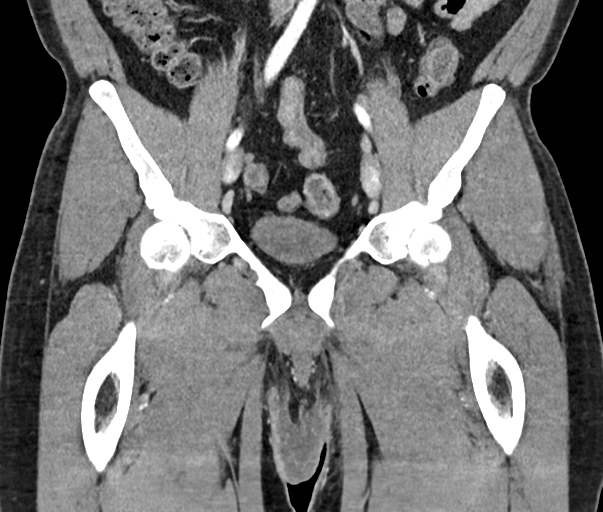
[im 81/146  soft-tissue]
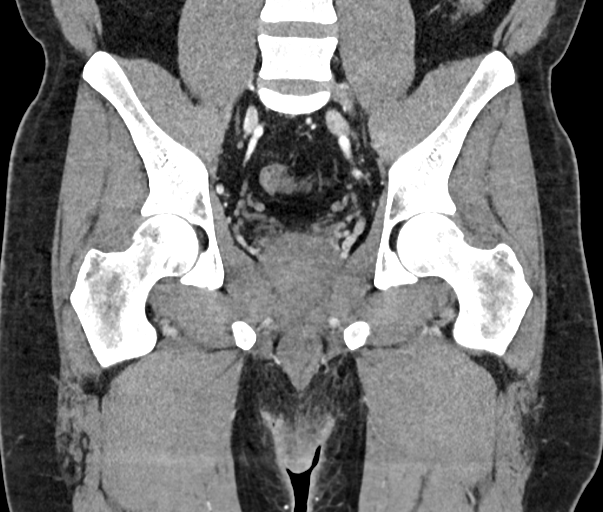

[17 of 46 positions shown; findings below may reference images not displayed]

FINDINGS: Urinary Tract: Normal nondistended bladder. Normal caliber pelvic
ureters.

Bowel: Normal caliber pelvic small and large bowel loops. No bowel
wall thickening. No evidence of perirectal or perianal fluid
collection.

Vascular/Lymphatic: Mild bilateral iliofemoral atherosclerosis with
no acute vascular abnormality in the pelvis. No pathologically
enlarged pelvic nodes.

Reproductive: Normal size prostate and seminal vesicles. Generalized
mild scrotal edema without focal fluid collection or mass.

Other: Mild irregular skin thickening in the medial gluteal cleft
bilaterally, which appears chronic and mildly worsened since
08/24/2018 CT. No associated subcutaneous or deep buttock fluid
collections or soft tissue emphysema.

Musculoskeletal: No aggressive appearing focal osseous lesions.
IMPRESSION: Mild irregular skin thickening in the medial gluteal cleft
bilaterally, which appears chronic and mildly worsened since
08/24/2018 CT. Generalized mild scrotal edema without focal fluid
collection. No pelvic abscess.
